# Patient Record
Sex: Male | Born: 1965 | Race: Black or African American | Hispanic: No | Smoking: Never smoker
Health system: Southern US, Community
[De-identification: ages and names within clinical notes are randomized; demographics above are authoritative.]

## PROBLEM LIST (undated history)

## (undated) DIAGNOSIS — I10 Essential (primary) hypertension: Secondary | ICD-10-CM

## (undated) DIAGNOSIS — J45909 Unspecified asthma, uncomplicated: Secondary | ICD-10-CM

## (undated) DIAGNOSIS — K219 Gastro-esophageal reflux disease without esophagitis: Secondary | ICD-10-CM

## (undated) HISTORY — PX: VASECTOMY: SHX75

---

## 2016-03-06 ENCOUNTER — Other Ambulatory Visit: Payer: Self-pay | Admitting: Gastroenterology

## 2016-03-06 DIAGNOSIS — R1084 Generalized abdominal pain: Secondary | ICD-10-CM

## 2016-03-15 ENCOUNTER — Ambulatory Visit: Admission: RE | Admit: 2016-03-15 | Payer: BLUE CROSS/BLUE SHIELD | Source: Ambulatory Visit

## 2016-05-06 DIAGNOSIS — B009 Herpesviral infection, unspecified: Secondary | ICD-10-CM

## 2016-05-06 HISTORY — DX: Herpesviral infection, unspecified: B00.9

## 2017-05-29 ENCOUNTER — Other Ambulatory Visit: Payer: Self-pay | Admitting: Orthopedic Surgery

## 2017-05-29 DIAGNOSIS — G8929 Other chronic pain: Secondary | ICD-10-CM

## 2017-05-29 DIAGNOSIS — S72491A Other fracture of lower end of right femur, initial encounter for closed fracture: Secondary | ICD-10-CM

## 2017-05-29 DIAGNOSIS — M25561 Pain in right knee: Principal | ICD-10-CM

## 2017-06-10 ENCOUNTER — Encounter (INDEPENDENT_AMBULATORY_CARE_PROVIDER_SITE_OTHER): Payer: Self-pay

## 2017-06-10 ENCOUNTER — Ambulatory Visit
Admission: RE | Admit: 2017-06-10 | Discharge: 2017-06-10 | Disposition: A | Discharge status: Home / Self Care | Payer: No Typology Code available for payment source | Source: Ambulatory Visit | Attending: Orthopedic Surgery | Admitting: Orthopedic Surgery

## 2017-06-10 DIAGNOSIS — S72491A Other fracture of lower end of right femur, initial encounter for closed fracture: Secondary | ICD-10-CM | POA: Diagnosis not present

## 2017-06-10 DIAGNOSIS — S83221A Peripheral tear of medial meniscus, current injury, right knee, initial encounter: Secondary | ICD-10-CM | POA: Diagnosis not present

## 2017-06-10 DIAGNOSIS — M7121 Synovial cyst of popliteal space [Baker], right knee: Secondary | ICD-10-CM | POA: Diagnosis not present

## 2017-06-10 DIAGNOSIS — M25461 Effusion, right knee: Secondary | ICD-10-CM | POA: Diagnosis not present

## 2017-06-10 DIAGNOSIS — M25561 Pain in right knee: Secondary | ICD-10-CM | POA: Diagnosis not present

## 2017-06-10 DIAGNOSIS — M2351 Chronic instability of knee, right knee: Secondary | ICD-10-CM | POA: Diagnosis present

## 2017-06-10 DIAGNOSIS — M2341 Loose body in knee, right knee: Secondary | ICD-10-CM | POA: Diagnosis not present

## 2017-06-10 DIAGNOSIS — M25861 Other specified joint disorders, right knee: Secondary | ICD-10-CM | POA: Insufficient documentation

## 2017-06-10 DIAGNOSIS — G8929 Other chronic pain: Secondary | ICD-10-CM | POA: Insufficient documentation

## 2017-06-25 ENCOUNTER — Other Ambulatory Visit: Payer: Self-pay | Admitting: Orthopedic Surgery

## 2017-06-25 DIAGNOSIS — M25569 Pain in unspecified knee: Secondary | ICD-10-CM

## 2017-07-09 ENCOUNTER — Ambulatory Visit
Admission: RE | Admit: 2017-07-09 | Discharge: 2017-07-09 | Disposition: A | Discharge status: Home / Self Care | Payer: No Typology Code available for payment source | Source: Ambulatory Visit | Attending: Orthopedic Surgery | Admitting: Orthopedic Surgery

## 2017-07-09 ENCOUNTER — Other Ambulatory Visit: Payer: Self-pay | Admitting: Orthopedic Surgery

## 2017-07-09 DIAGNOSIS — M21162 Varus deformity, not elsewhere classified, left knee: Secondary | ICD-10-CM | POA: Insufficient documentation

## 2017-07-09 DIAGNOSIS — M25569 Pain in unspecified knee: Secondary | ICD-10-CM | POA: Insufficient documentation

## 2017-07-09 DIAGNOSIS — Z01818 Encounter for other preprocedural examination: Secondary | ICD-10-CM | POA: Insufficient documentation

## 2017-07-09 DIAGNOSIS — M21161 Varus deformity, not elsewhere classified, right knee: Secondary | ICD-10-CM | POA: Insufficient documentation

## 2017-08-11 ENCOUNTER — Inpatient Hospital Stay: Admission: RE | Admit: 2017-08-11 | Payer: No Typology Code available for payment source | Source: Ambulatory Visit

## 2017-08-18 ENCOUNTER — Ambulatory Visit
Admission: RE | Admit: 2017-08-18 | Payer: No Typology Code available for payment source | Source: Ambulatory Visit | Admitting: Orthopedic Surgery

## 2017-08-18 ENCOUNTER — Encounter: Admission: RE | Payer: Self-pay | Source: Ambulatory Visit

## 2017-08-18 SURGERY — APPLICATION, GRAFT, OSTEOCHONDRAL, KNEE
Anesthesia: Choice | Laterality: Right

## 2018-11-03 ENCOUNTER — Other Ambulatory Visit: Payer: Self-pay | Admitting: Orthopedic Surgery

## 2018-11-03 DIAGNOSIS — M25569 Pain in unspecified knee: Secondary | ICD-10-CM

## 2018-11-05 ENCOUNTER — Other Ambulatory Visit: Payer: Self-pay | Admitting: Orthopedic Surgery

## 2018-11-05 DIAGNOSIS — M93261 Osteochondritis dissecans, right knee: Secondary | ICD-10-CM

## 2018-11-24 ENCOUNTER — Ambulatory Visit
Admission: RE | Admit: 2018-11-24 | Discharge: 2018-11-24 | Disposition: A | Discharge status: Home / Self Care | Payer: PRIVATE HEALTH INSURANCE | Source: Ambulatory Visit | Attending: Orthopedic Surgery | Admitting: Orthopedic Surgery

## 2018-11-24 ENCOUNTER — Other Ambulatory Visit: Payer: Self-pay

## 2018-11-24 DIAGNOSIS — M93261 Osteochondritis dissecans, right knee: Secondary | ICD-10-CM | POA: Diagnosis present

## 2018-11-24 DIAGNOSIS — M25569 Pain in unspecified knee: Secondary | ICD-10-CM | POA: Insufficient documentation

## 2019-02-18 ENCOUNTER — Encounter
Admission: RE | Admit: 2019-02-18 | Discharge: 2019-02-18 | Disposition: A | Discharge status: Home / Self Care | Payer: PRIVATE HEALTH INSURANCE | Source: Ambulatory Visit | Attending: Surgery | Admitting: Surgery

## 2019-02-18 ENCOUNTER — Other Ambulatory Visit: Payer: Self-pay

## 2019-02-18 DIAGNOSIS — I10 Essential (primary) hypertension: Secondary | ICD-10-CM | POA: Diagnosis not present

## 2019-02-18 DIAGNOSIS — Z01812 Encounter for preprocedural laboratory examination: Secondary | ICD-10-CM | POA: Diagnosis not present

## 2019-02-18 HISTORY — DX: Essential (primary) hypertension: I10

## 2019-02-18 LAB — URINALYSIS, ROUTINE W REFLEX MICROSCOPIC
Bilirubin Urine: NEGATIVE
Glucose, UA: NEGATIVE mg/dL
Hgb urine dipstick: NEGATIVE
Ketones, ur: NEGATIVE mg/dL
Leukocytes,Ua: NEGATIVE
Nitrite: NEGATIVE
Protein, ur: NEGATIVE mg/dL
Specific Gravity, Urine: 1.015 (ref 1.005–1.030)
pH: 6 (ref 5.0–8.0)

## 2019-02-18 LAB — TYPE AND SCREEN
ABO/RH(D): O POS
Antibody Screen: NEGATIVE

## 2019-02-18 LAB — CBC
HCT: 37.2 % — ABNORMAL LOW (ref 39.0–52.0)
Hemoglobin: 11.9 g/dL — ABNORMAL LOW (ref 13.0–17.0)
MCH: 26.9 pg (ref 26.0–34.0)
MCHC: 32 g/dL (ref 30.0–36.0)
MCV: 84 fL (ref 80.0–100.0)
Platelets: 296 10*3/uL (ref 150–400)
RBC: 4.43 MIL/uL (ref 4.22–5.81)
RDW: 13.1 % (ref 11.5–15.5)
WBC: 5 10*3/uL (ref 4.0–10.5)
nRBC: 0 % (ref 0.0–0.2)

## 2019-02-18 LAB — SURGICAL PCR SCREEN
MRSA, PCR: NEGATIVE
Staphylococcus aureus: POSITIVE — AB

## 2019-02-18 LAB — BASIC METABOLIC PANEL
Anion gap: 8 (ref 5–15)
BUN: 15 mg/dL (ref 6–20)
CO2: 30 mmol/L (ref 22–32)
Calcium: 9.6 mg/dL (ref 8.9–10.3)
Chloride: 102 mmol/L (ref 98–111)
Creatinine, Ser: 1.02 mg/dL (ref 0.61–1.24)
GFR calc Af Amer: >60 mL/min (ref >60)
GFR calc non Af Amer: >60 mL/min (ref >60)
Glucose, Bld: 98 mg/dL (ref 70–99)
Potassium: 4.2 mmol/L (ref 3.5–5.1)
Sodium: 140 mmol/L (ref 135–145)

## 2019-02-18 NOTE — Patient Instructions (Signed)
Your procedure is scheduled on: Thursday 02/25/19.  Report to DAY SURGERY DEPARTMENT LOCATED ON 2ND FLOOR MEDICAL MALL ENTRANCE. To find out your arrival time please call [phone] between 1PM - 3PM on Wednesday 02/24/19.   Remember: Instructions that are not followed completely may result in serious medical risk, up to and including death, or upon the discretion of your surgeon and anesthesiologist your surgery may need to be rescheduled.      _X__ 1. Do not eat food after midnight the night before your procedure.                 No gum chewing or hard candies. You may drink clear liquids up to 2 hours                 before you are scheduled to arrive for your surgery- DO NOT drink clear                 liquids within 2 hours of the start of your surgery.                 Clear Liquids include:  water, apple juice without pulp, clear carbohydrate                 drink such as Clearfast or Gatorade, Black Coffee or Tea (Do not add                 milk or creamer to coffee or tea).    __X__2.  On the morning of surgery brush your teeth with toothpaste and water, you may rinse your mouth with mouthwash if you wish.  Do not swallow any toothpaste or mouthwash.      __X__3.  Notify your doctor if there is any change in your medical condition      (cold, fever, infections).      Do not wear jewelry, make-up, hairpins, clips or nail polish. Do not wear lotions, powders, or perfumes.  Do not shave 48 hours prior to surgery. Men may shave face and neck. Do not bring valuables to the hospital.      [facility] is not responsible for any belongings or valuables.    Contacts, dentures/partials or body piercings may not be worn into surgery. Bring a case for your contacts, glasses or hearing aids, a denture cup will be supplied.     Please read over the following fact sheets that you were given:   MRSA Information     __X__ Take these medicines the morning of surgery with A  SIP OF WATER:     1. amLODipine (NORVASC) 5 MG tablet      __X__ Use CHG Soap as directed    __X__ Stop Anti-inflammatories 7 days before surgery such as Advil, Ibuprofen, Motrin, BC or Goodies Powder, Naprosyn, Naproxen, Aleve, Aspirin, Meloxicam. May take Tylenol if needed for pain or discomfort.     __X__ Stop the following herbal supplements today:  Ascorbic Acid (VITAMIN C) 100 MG tablet

## 2019-02-18 NOTE — Progress Notes (Signed)
Pre-Admit Testing Provider Notification Note  Provider Notified: Dr. Roland Rack  Notification Mode: Fax  Reason: Abnormal PCR results.   Response: Fax confirmation received.  Additional Information: Noted on Pre-Admit Worksheet. Placed on chart.  Signed: Beulah Gandy, RN

## 2019-02-19 LAB — URINE CULTURE: Culture: NO GROWTH

## 2019-02-22 ENCOUNTER — Other Ambulatory Visit: Admission: RE | Admit: 2019-02-22 | Payer: PRIVATE HEALTH INSURANCE | Source: Ambulatory Visit

## 2019-02-23 ENCOUNTER — Other Ambulatory Visit: Payer: Self-pay

## 2019-02-23 ENCOUNTER — Other Ambulatory Visit
Admission: RE | Admit: 2019-02-23 | Discharge: 2019-02-23 | Disposition: A | Discharge status: Home / Self Care | Payer: PRIVATE HEALTH INSURANCE | Source: Ambulatory Visit | Attending: Surgery | Admitting: Surgery

## 2019-02-23 LAB — SARS CORONAVIRUS 2 (TAT 6-24 HRS): SARS Coronavirus 2: NEGATIVE

## 2019-02-24 MED ORDER — CEFAZOLIN SODIUM-DEXTROSE 2-4 GM/100ML-% IV SOLN
2.0000 g | Freq: Once | INTRAVENOUS | Status: AC
  Administered 2019-02-25: 2 g via INTRAVENOUS

## 2019-02-25 ENCOUNTER — Inpatient Hospital Stay: Payer: PRIVATE HEALTH INSURANCE | Admitting: Anesthesiology

## 2019-02-25 ENCOUNTER — Observation Stay: Payer: PRIVATE HEALTH INSURANCE

## 2019-02-25 ENCOUNTER — Inpatient Hospital Stay
Admission: RE | Admit: 2019-02-25 | Discharge: 2019-02-26 | DRG: 470 | Disposition: A | Discharge status: Home / Self Care | Payer: PRIVATE HEALTH INSURANCE | Attending: Surgery | Admitting: Surgery

## 2019-02-25 ENCOUNTER — Encounter
Admission: RE | Disposition: A | Discharge status: Home / Self Care | Payer: Self-pay | Source: Home / Self Care | Attending: Surgery

## 2019-02-25 ENCOUNTER — Encounter: Payer: Self-pay | Admitting: *Deleted

## 2019-02-25 ENCOUNTER — Other Ambulatory Visit: Payer: Self-pay

## 2019-02-25 DIAGNOSIS — Z96651 Presence of right artificial knee joint: Secondary | ICD-10-CM

## 2019-02-25 DIAGNOSIS — I10 Essential (primary) hypertension: Secondary | ICD-10-CM | POA: Diagnosis present

## 2019-02-25 DIAGNOSIS — M25761 Osteophyte, right knee: Secondary | ICD-10-CM | POA: Diagnosis present

## 2019-02-25 DIAGNOSIS — M21861 Other specified acquired deformities of right lower leg: Secondary | ICD-10-CM | POA: Diagnosis present

## 2019-02-25 DIAGNOSIS — Z20828 Contact with and (suspected) exposure to other viral communicable diseases: Secondary | ICD-10-CM | POA: Diagnosis present

## 2019-02-25 HISTORY — PX: PARTIAL KNEE ARTHROPLASTY: SHX2174

## 2019-02-25 LAB — ABO/RH: ABO/RH(D): O POS

## 2019-02-25 SURGERY — ARTHROPLASTY, KNEE, UNICOMPARTMENTAL
Anesthesia: Spinal | Site: Knee | Laterality: Right

## 2019-02-25 MED ORDER — TRAMADOL HCL 50 MG PO TABS
50.0000 mg | ORAL_TABLET | Freq: Four times a day (QID) | ORAL | Status: DC | PRN
  Administered 2019-02-26: 50 mg via ORAL
  Filled 2019-02-25: qty 1

## 2019-02-25 MED ORDER — LIDOCAINE HCL (PF) 2 % IJ SOLN
INTRAMUSCULAR | Status: AC
  Filled 2019-02-25: qty 10

## 2019-02-25 MED ORDER — MAGNESIUM HYDROXIDE 400 MG/5ML PO SUSP: 30.0000 mL | Freq: Every day | ORAL | Status: DC | PRN

## 2019-02-25 MED ORDER — SODIUM CHLORIDE 0.9 % IV SOLN
INTRAVENOUS | Status: DC | PRN
  Administered 2019-02-25: 08:00:00 30 ug/min via INTRAVENOUS

## 2019-02-25 MED ORDER — ONDANSETRON HCL 4 MG PO TABS: 4.0000 mg | ORAL_TABLET | Freq: Four times a day (QID) | ORAL | Status: DC | PRN

## 2019-02-25 MED ORDER — HYDROCODONE-ACETAMINOPHEN 5-325 MG PO TABS: 1.0000 | ORAL_TABLET | ORAL | Status: DC | PRN

## 2019-02-25 MED ORDER — FLEET ENEMA 7-19 GM/118ML RE ENEM: 1.0000 | ENEMA | Freq: Once | RECTAL | Status: DC | PRN

## 2019-02-25 MED ORDER — BUPIVACAINE LIPOSOME 1.3 % IJ SUSP
INTRAMUSCULAR | Status: AC
  Filled 2019-02-25: qty 20

## 2019-02-25 MED ORDER — ADULT MULTIVITAMIN W/MINERALS CH
1.0000 | ORAL_TABLET | Freq: Every day | ORAL | Status: DC
  Administered 2019-02-25 – 2019-02-26 (×2): 1 via ORAL
  Filled 2019-02-25 (×2): qty 1

## 2019-02-25 MED ORDER — ENOXAPARIN SODIUM 40 MG/0.4ML ~~LOC~~ SOLN
40.0000 mg | SUBCUTANEOUS | Status: DC
  Administered 2019-02-26: 40 mg via SUBCUTANEOUS
  Filled 2019-02-25: qty 0.4

## 2019-02-25 MED ORDER — TRANEXAMIC ACID 1000 MG/10ML IV SOLN
INTRAVENOUS | Status: AC
  Filled 2019-02-25: qty 10

## 2019-02-25 MED ORDER — KETOROLAC TROMETHAMINE 30 MG/ML IJ SOLN
30.0000 mg | Freq: Once | INTRAMUSCULAR | Status: AC
  Administered 2019-02-25: 11:00:00 30 mg via INTRAVENOUS

## 2019-02-25 MED ORDER — PROPOFOL 10 MG/ML IV BOLUS
INTRAVENOUS | Status: DC | PRN
  Administered 2019-02-25 (×2): 18 mg via INTRAVENOUS

## 2019-02-25 MED ORDER — VITAMIN C 100 MG PO TABS: 100.0000 mg | ORAL_TABLET | Freq: Every day | ORAL | Status: DC

## 2019-02-25 MED ORDER — SODIUM CHLORIDE 0.9 % IV SOLN
INTRAVENOUS | Status: DC
  Administered 2019-02-25 – 2019-02-26 (×2): via INTRAVENOUS

## 2019-02-25 MED ORDER — CEFAZOLIN SODIUM-DEXTROSE 2-4 GM/100ML-% IV SOLN
INTRAVENOUS | Status: AC
  Filled 2019-02-25: qty 100

## 2019-02-25 MED ORDER — FAMOTIDINE 20 MG PO TABS: 20.0000 mg | ORAL_TABLET | Freq: Once | ORAL | Status: DC

## 2019-02-25 MED ORDER — CEFAZOLIN SODIUM-DEXTROSE 2-4 GM/100ML-% IV SOLN
2.0000 g | Freq: Four times a day (QID) | INTRAVENOUS | Status: AC
  Administered 2019-02-25 – 2019-02-26 (×3): 2 g via INTRAVENOUS
  Filled 2019-02-25 (×3): qty 100

## 2019-02-25 MED ORDER — MIDAZOLAM HCL 5 MG/5ML IJ SOLN
INTRAMUSCULAR | Status: DC | PRN
  Administered 2019-02-25: 2 mg via INTRAVENOUS

## 2019-02-25 MED ORDER — FENTANYL CITRATE (PF) 100 MCG/2ML IJ SOLN: 25.0000 ug | INTRAMUSCULAR | Status: DC | PRN

## 2019-02-25 MED ORDER — GLYCOPYRROLATE 0.2 MG/ML IJ SOLN
INTRAMUSCULAR | Status: AC
  Filled 2019-02-25: qty 1

## 2019-02-25 MED ORDER — PHENYLEPHRINE HCL (PRESSORS) 10 MG/ML IV SOLN
INTRAVENOUS | Status: AC
  Filled 2019-02-25: qty 1

## 2019-02-25 MED ORDER — MIDAZOLAM HCL 2 MG/2ML IJ SOLN
INTRAMUSCULAR | Status: AC
  Filled 2019-02-25: qty 2

## 2019-02-25 MED ORDER — ACETAMINOPHEN 500 MG PO TABS
500.0000 mg | ORAL_TABLET | Freq: Four times a day (QID) | ORAL | Status: AC
  Administered 2019-02-25 – 2019-02-26 (×4): 500 mg via ORAL
  Filled 2019-02-25 (×4): qty 1

## 2019-02-25 MED ORDER — KETOROLAC TROMETHAMINE 30 MG/ML IJ SOLN
INTRAMUSCULAR | Status: AC
  Administered 2019-02-25: 30 mg via INTRAVENOUS
  Filled 2019-02-25: qty 1

## 2019-02-25 MED ORDER — METOCLOPRAMIDE HCL 10 MG PO TABS: 5.0000 mg | ORAL_TABLET | Freq: Three times a day (TID) | ORAL | Status: DC | PRN

## 2019-02-25 MED ORDER — LACTATED RINGERS IV SOLN
INTRAVENOUS | Status: DC
  Administered 2019-02-25: 07:00:00 via INTRAVENOUS

## 2019-02-25 MED ORDER — EPINEPHRINE PF 1 MG/ML IJ SOLN
INTRAMUSCULAR | Status: AC
  Filled 2019-02-25: qty 1

## 2019-02-25 MED ORDER — FENTANYL CITRATE (PF) 100 MCG/2ML IJ SOLN
INTRAMUSCULAR | Status: DC | PRN
  Administered 2019-02-25 (×2): 50 ug via INTRAVENOUS

## 2019-02-25 MED ORDER — BISACODYL 10 MG RE SUPP: 10.0000 mg | Freq: Every day | RECTAL | Status: DC | PRN

## 2019-02-25 MED ORDER — ONDANSETRON HCL 4 MG/2ML IJ SOLN: 4.0000 mg | Freq: Four times a day (QID) | INTRAMUSCULAR | Status: DC | PRN

## 2019-02-25 MED ORDER — OXYCODONE HCL 5 MG/5ML PO SOLN: 5.0000 mg | Freq: Once | ORAL | Status: DC | PRN

## 2019-02-25 MED ORDER — DIPHENHYDRAMINE HCL 12.5 MG/5ML PO ELIX: 12.5000 mg | ORAL_SOLUTION | ORAL | Status: DC | PRN

## 2019-02-25 MED ORDER — VITAMIN C 500 MG PO TABS
1000.0000 mg | ORAL_TABLET | Freq: Every day | ORAL | Status: DC
  Administered 2019-02-26: 1000 mg via ORAL
  Filled 2019-02-25: qty 2

## 2019-02-25 MED ORDER — PROPOFOL 500 MG/50ML IV EMUL
INTRAVENOUS | Status: AC
  Filled 2019-02-25: qty 50

## 2019-02-25 MED ORDER — KETOROLAC TROMETHAMINE 15 MG/ML IJ SOLN
15.0000 mg | Freq: Four times a day (QID) | INTRAMUSCULAR | Status: DC
  Administered 2019-02-25 – 2019-02-26 (×3): 15 mg via INTRAVENOUS
  Filled 2019-02-25 (×3): qty 1

## 2019-02-25 MED ORDER — AMLODIPINE BESYLATE 5 MG PO TABS: 5.0000 mg | ORAL_TABLET | Freq: Every day | ORAL | Status: DC

## 2019-02-25 MED ORDER — PROPOFOL 500 MG/50ML IV EMUL
INTRAVENOUS | Status: DC | PRN
  Administered 2019-02-25: 60 ug/kg/min via INTRAVENOUS

## 2019-02-25 MED ORDER — METOCLOPRAMIDE HCL 5 MG/ML IJ SOLN: 5.0000 mg | Freq: Three times a day (TID) | INTRAMUSCULAR | Status: DC | PRN

## 2019-02-25 MED ORDER — TRANEXAMIC ACID 1000 MG/10ML IV SOLN
INTRAVENOUS | Status: DC | PRN
  Administered 2019-02-25: 1000 mg via TOPICAL

## 2019-02-25 MED ORDER — BUPIVACAINE-EPINEPHRINE (PF) 0.5% -1:200000 IJ SOLN
INTRAMUSCULAR | Status: DC | PRN
  Administered 2019-02-25: 30 mL

## 2019-02-25 MED ORDER — BUPIVACAINE HCL (PF) 0.5 % IJ SOLN
INTRAMUSCULAR | Status: DC | PRN
  Administered 2019-02-25: 3 mL

## 2019-02-25 MED ORDER — BUPIVACAINE HCL (PF) 0.5 % IJ SOLN
INTRAMUSCULAR | Status: AC
  Filled 2019-02-25: qty 10

## 2019-02-25 MED ORDER — DOCUSATE SODIUM 100 MG PO CAPS
100.0000 mg | ORAL_CAPSULE | Freq: Two times a day (BID) | ORAL | Status: DC
  Administered 2019-02-26: 100 mg via ORAL
  Filled 2019-02-25 (×2): qty 1

## 2019-02-25 MED ORDER — SODIUM CHLORIDE 0.9 % IV SOLN
INTRAVENOUS | Status: DC | PRN
  Administered 2019-02-25: 09:00:00 60 mL

## 2019-02-25 MED ORDER — SILDENAFIL CITRATE 100 MG PO TABS: 100.0000 mg | ORAL_TABLET | Freq: Every day | ORAL | Status: DC | PRN

## 2019-02-25 MED ORDER — SODIUM CHLORIDE FLUSH 0.9 % IV SOLN
INTRAVENOUS | Status: AC
  Filled 2019-02-25: qty 40

## 2019-02-25 MED ORDER — BUPIVACAINE HCL (PF) 0.5 % IJ SOLN
INTRAMUSCULAR | Status: AC
  Filled 2019-02-25: qty 30

## 2019-02-25 MED ORDER — MORPHINE SULFATE (PF) 2 MG/ML IV SOLN: 2.0000 mg | INTRAVENOUS | Status: DC | PRN

## 2019-02-25 MED ORDER — FAMOTIDINE 20 MG PO TABS
ORAL_TABLET | ORAL | Status: AC
  Filled 2019-02-25: qty 1

## 2019-02-25 MED ORDER — FENTANYL CITRATE (PF) 100 MCG/2ML IJ SOLN
INTRAMUSCULAR | Status: AC
  Filled 2019-02-25: qty 2

## 2019-02-25 MED ORDER — ACETAMINOPHEN 325 MG PO TABS
325.0000 mg | ORAL_TABLET | Freq: Four times a day (QID) | ORAL | Status: DC | PRN
  Administered 2019-02-26: 650 mg via ORAL
  Filled 2019-02-25: qty 2

## 2019-02-25 MED ORDER — OXYCODONE HCL 5 MG PO TABS: 5.0000 mg | ORAL_TABLET | Freq: Once | ORAL | Status: DC | PRN

## 2019-02-25 MED ORDER — PROPOFOL 10 MG/ML IV BOLUS
INTRAVENOUS | Status: AC
  Filled 2019-02-25: qty 20

## 2019-02-25 SURGICAL SUPPLY — 66 items
BEARING MENISCAL TIBIAL 3 MD R (Orthopedic Implant) ×3 IMPLANT
BNDG ELASTIC 6X5.8 VLCR STR LF (GAUZE/BANDAGES/DRESSINGS) ×3 IMPLANT
CANISTER SUCT 1200ML W/VALVE (MISCELLANEOUS) ×3 IMPLANT
CANISTER SUCT 3000ML PPV (MISCELLANEOUS) ×3 IMPLANT
CEMENT BONE R 1X40 (Cement) ×3 IMPLANT
CEMENT VACUUM MIXING SYSTEM (MISCELLANEOUS) ×3 IMPLANT
CHLORAPREP W/TINT 26 (MISCELLANEOUS) ×6 IMPLANT
COOLER POLAR GLACIER W/PUMP (MISCELLANEOUS) ×3 IMPLANT
COVER MAYO STAND REUSABLE (DRAPES) ×6 IMPLANT
COVER WAND RF STERILE (DRAPES) ×3 IMPLANT
CUFF TOURN SGL QUICK 24 (TOURNIQUET CUFF)
CUFF TOURN SGL QUICK 30 (TOURNIQUET CUFF) ×2
CUFF TRNQT CYL 24X4X16.5-23 (TOURNIQUET CUFF) IMPLANT
CUFF TRNQT CYL 30X4X21-28X (TOURNIQUET CUFF) ×1 IMPLANT
DRAPE C-ARM XRAY 36X54 (DRAPES) ×3 IMPLANT
DRSG OPSITE POSTOP 4X12 (GAUZE/BANDAGES/DRESSINGS) IMPLANT
DRSG OPSITE POSTOP 4X14 (GAUZE/BANDAGES/DRESSINGS) IMPLANT
DRSG OPSITE POSTOP 4X6 (GAUZE/BANDAGES/DRESSINGS) ×3 IMPLANT
DRSG OPSITE POSTOP 4X8 (GAUZE/BANDAGES/DRESSINGS) ×3 IMPLANT
ELECT CAUTERY BLADE 6.4 (BLADE) ×3 IMPLANT
ELECT REM PT RETURN 9FT ADLT (ELECTROSURGICAL) ×3
ELECTRODE REM PT RTRN 9FT ADLT (ELECTROSURGICAL) ×1 IMPLANT
GAUZE 4X4 16PLY RFD (DISPOSABLE) ×3 IMPLANT
GAUZE SPONGE 4X4 12PLY STRL (GAUZE/BANDAGES/DRESSINGS) ×3 IMPLANT
GAUZE XEROFORM 1X8 LF (GAUZE/BANDAGES/DRESSINGS) IMPLANT
GLOVE BIO SURGEON STRL SZ7.5 (GLOVE) ×12 IMPLANT
GLOVE BIO SURGEON STRL SZ8 (GLOVE) ×12 IMPLANT
GLOVE BIOGEL PI IND STRL 8 (GLOVE) ×1 IMPLANT
GLOVE BIOGEL PI INDICATOR 8 (GLOVE) ×2
GLOVE INDICATOR 8.0 STRL GRN (GLOVE) ×6 IMPLANT
GLOVE SURG SYN 6.5 ES PF (GLOVE) ×9 IMPLANT
GOWN STRL REUS W/ TWL LRG LVL3 (GOWN DISPOSABLE) ×3 IMPLANT
GOWN STRL REUS W/ TWL XL LVL3 (GOWN DISPOSABLE) ×1 IMPLANT
GOWN STRL REUS W/TWL LRG LVL3 (GOWN DISPOSABLE) ×6
GOWN STRL REUS W/TWL XL LVL3 (GOWN DISPOSABLE) ×2
HOLDER FOLEY CATH W/STRAP (MISCELLANEOUS) ×3 IMPLANT
HOOD PEEL AWAY FLYTE STAYCOOL (MISCELLANEOUS) ×9 IMPLANT
KIT TURNOVER KIT A (KITS) ×3 IMPLANT
MAT ABSORB  FLUID 56X50 GRAY (MISCELLANEOUS) ×2
MAT ABSORB FLUID 56X50 GRAY (MISCELLANEOUS) ×1 IMPLANT
NDL SAFETY ECLIPSE 18X1.5 (NEEDLE) ×1 IMPLANT
NEEDLE HYPO 18GX1.5 SHARP (NEEDLE) ×2
NEEDLE SPNL 20GX3.5 QUINCKE YW (NEEDLE) ×3 IMPLANT
NS IRRIG 1000ML POUR BTL (IV SOLUTION) ×3 IMPLANT
PACK BLADE SAW RECIP 70 3 PT (BLADE) ×3 IMPLANT
PACK TOTAL KNEE (MISCELLANEOUS) ×3 IMPLANT
PAD WRAPON POLAR KNEE (MISCELLANEOUS) ×1 IMPLANT
PEG TWIN FEM CEMENTED MED (Knees) ×3 IMPLANT
PULSAVAC PLUS IRRIG FAN TIP (DISPOSABLE) ×3
SOL .9 NS 3000ML IRR  AL (IV SOLUTION) ×2
SOL .9 NS 3000ML IRR UROMATIC (IV SOLUTION) ×1 IMPLANT
STAPLER SKIN PROX 35W (STAPLE) ×3 IMPLANT
STRAP SAFETY 5IN WIDE (MISCELLANEOUS) ×3 IMPLANT
SUCTION FRAZIER HANDLE 10FR (MISCELLANEOUS) ×2
SUCTION TUBE FRAZIER 10FR DISP (MISCELLANEOUS) ×1 IMPLANT
SUT VIC AB 0 CT1 36 (SUTURE) ×3 IMPLANT
SUT VIC AB 2-0 CT1 27 (SUTURE) ×8
SUT VIC AB 2-0 CT1 TAPERPNT 27 (SUTURE) ×4 IMPLANT
SYR 10ML LL (SYRINGE) ×3 IMPLANT
SYR 20ML LL LF (SYRINGE) ×3 IMPLANT
SYR 30ML LL (SYRINGE) ×9 IMPLANT
TAPE TRANSPORE STRL 2 31045 (GAUZE/BANDAGES/DRESSINGS) ×3 IMPLANT
TIP FAN IRRIG PULSAVAC PLUS (DISPOSABLE) ×1 IMPLANT
TRAY FOLEY MTR SLVR 16FR STAT (SET/KITS/TRAYS/PACK) ×3 IMPLANT
TRAY TIBIAL OXFORD SZ C RT (Joint) ×3 IMPLANT
WRAPON POLAR PAD KNEE (MISCELLANEOUS) ×3

## 2019-02-25 NOTE — Progress Notes (Signed)
Physical Therapy Evaluation Patient Details Name: Jeremy Cox MRN: [id number] DOB: October 19, 1965 Today's Date: 02/25/2019   History of Present Illness  Pt underwent R unicondylar knee replacement without reported post-op complications. He is POD#0 at time of initial PT evaluation. PMH includes HTN  Clinical Impression  Pt admitted with above diagnosis. Pt currently with functional limitations due to the deficits listed below (see PT Problem List).  Pt is fully independent with bed mobility and supervision only for transfers. He demonstrates safe hand placement during transfers. Slight decrease in weight shift to RLE during transfer. He is steady in standing with minimal UE support on walker and is able to ambulate from bed to recliner with rolling walker. Education provided for proper sequencing utilizing step-to pattern. Pt demonstrates excellent stability with no buckling of RLE noted. He denies any dizziness or lightheadedness during transfers and ambulation. He is able to complete all supine exercises demonstrating good quad control. He is able to perform R SAQ and SLR without assistance. Will increase ambulation distance tomorrow and practice steps. He will need a rolling walker at discharge. Recommend HH PT however pending MD preference and transportation may be appropriate to start with OP PT. Pt will benefit from PT services to address deficits in strength, balance, and mobility in order to return to full function at home.     Follow Up Recommendations Home health PT;Other (comment)(Per MD preference and transportation may be OK for OP PT)    Equipment Recommendations  Rolling walker with 5" wheels    Recommendations for Other Services       Precautions / Restrictions Precautions Precautions: Knee Precaution Booklet Issued: Yes (comment) Restrictions Weight Bearing Restrictions: Yes RLE Weight Bearing: Weight bearing as tolerated      Mobility  Bed Mobility Overal bed mobility:  Independent             General bed mobility comments: Good speed and sequencing  Transfers Overall transfer level: Needs assistance Equipment used: Rolling walker (2 wheeled) Transfers: Sit to/from Stand Sit to Stand: Supervision         General transfer comment: Pt demonstrates safe hand placement during transfer. Slight decrease in weight shift to RLE during transfer. Pt is steady in standing with minimal UE support on walker  Ambulation/Gait Ambulation/Gait assistance: Min guard Gait Distance (Feet): 5 Feet Assistive device: Rolling walker (2 wheeled)       General Gait Details: Pt is able to ambulate from bed to recliner with rolling walker. Education provided for proper sequencing utilizing step-to pattern. Pt demonstrates excellent stability with no buckling of RLE noted. Dt denies any dizziness or lightheadedness during transfers and ambulation  Stairs            Wheelchair Mobility    Modified Rankin (Stroke Patients Only)       Balance Overall balance assessment: Needs assistance Sitting-balance support: No upper extremity supported Sitting balance-Leahy Scale: Good     Standing balance support: No upper extremity supported Standing balance-Leahy Scale: Fair Standing balance comment: Pt able to balance without UE support in standing                             Pertinent Vitals/Pain Pain Assessment: No/denies pain    Home Living Family/patient expects to be discharged to:: Private residence Living Arrangements: Other relatives Available Help at Discharge: Family Type of Home: House Home Access: Level entry     Home Layout: Multi-level;1/2 bath on main  level;Able to live on main level with bedroom/bathroom Home Equipment: Shower seat - built in      Prior Function Level of Independence: Independent         Comments: Independent with ADLs/IADLs, no falls, drives, works full time     Higher education careers adviser   Dominant Hand:  Right    Extremity/Trunk Assessment   Upper Extremity Assessment Upper Extremity Assessment: Overall WFL for tasks assessed    Lower Extremity Assessment Lower Extremity Assessment: RLE deficits/detail RLE Deficits / Details: Pt reports minimal numbness bilateral feet but otherwise sensation appears intact. Pt able to perform full SAQ and SLR without assistance       Communication   Communication: No difficulties  Cognition Arousal/Alertness: Awake/alert Behavior During Therapy: WFL for tasks assessed/performed Overall Cognitive Status: Within Functional Limits for tasks assessed                                        General Comments      Exercises Total Joint Exercises Ankle Circles/Pumps: AROM;Both;10 reps Quad Sets: Strengthening;Both;10 reps Gluteal Sets: Strengthening;Both;10 reps Short Arc Quad: Strengthening;Right;10 reps Heel Slides: Strengthening;Right;10 reps Hip ABduction/ADduction: Strengthening;Right;10 reps Straight Leg Raises: Strengthening;Right;10 reps Goniometric ROM: R knee extension is close to 0 but limited slightly by dressing. R knee flexion at least 60 degrees but also significantly limited by dressing at time of evaluation. Minimal pain evident close to end range R knee flexion   Assessment/Plan    PT Assessment Patient needs continued PT services  PT Problem List Decreased strength;Decreased activity tolerance;Decreased range of motion;Decreased balance;Decreased mobility       PT Treatment Interventions DME instruction;Gait training;Stair training;Functional mobility training;Therapeutic activities;Therapeutic exercise;Balance training;Neuromuscular re-education;Patient/family education;Manual techniques    PT Goals (Current goals can be found in the Care Plan section)  Acute Rehab PT Goals Patient Stated Goal: Return to prior level of function at home PT Goal Formulation: With patient Time For Goal Achievement:  03/11/19 Potential to Achieve Goals: Good    Frequency BID   Barriers to discharge        Co-evaluation               AM-PAC PT "6 Clicks" Mobility  Outcome Measure Help needed turning from your back to your side while in a flat bed without using bedrails?: None Help needed moving from lying on your back to sitting on the side of a flat bed without using bedrails?: None Help needed moving to and from a bed to a chair (including a wheelchair)?: A Little Help needed standing up from a chair using your arms (e.g., wheelchair or bedside chair)?: A Little Help needed to walk in hospital room?: A Little Help needed climbing 3-5 steps with a railing? : A Lot 6 Click Score: 19    End of Session Equipment Utilized During Treatment: Gait belt Activity Tolerance: Patient tolerated treatment well Patient left: in chair;with call bell/phone within reach;with chair alarm set;with nursing/sitter in room;with SCD's reapplied;Other (comment)(towel roll under heel, polar care in place) Nurse Communication: Other (comment)(Mobility status written on dry erase board) PT Visit Diagnosis: Muscle weakness (generalized) (M62.81);Other abnormalities of gait and mobility (R26.89);Difficulty in walking, not elsewhere classified (R26.2)    Time: 0347-4259 PT Time Calculation (min) (ACUTE ONLY): 26 min   Charges:   PT Evaluation $PT Eval Low Complexity: 1 Low PT Treatments $Therapeutic Exercise: 8-22 mins  Sharalyn InkJason D Thorn Cox PT, DPT, GCS   Jeremy Cox 02/25/2019, 5:16 PM

## 2019-02-25 NOTE — Anesthesia Procedure Notes (Signed)
Spinal  Patient location during procedure: OR Start time: 02/25/2019 7:35 AM End time: 02/25/2019 7:38 AM Staffing Resident/CRNA: Bernardo Heater, CRNA Performed: resident/CRNA  Preanesthetic Checklist Completed: patient identified, site marked, surgical consent, pre-op evaluation, timeout performed, IV checked, risks and benefits discussed and monitors and equipment checked Spinal Block Patient position: sitting Prep: ChloraPrep Patient monitoring: heart rate, continuous pulse ox, blood pressure and cardiac monitor Approach: midline Location: L3-4 Injection technique: single-shot Needle Needle type: Introducer and Pencan  Needle gauge: 24 G Needle length: 9 cm Additional Notes Negative paresthesia. Negative blood return. Positive free-flowing CSF. Expiration date of kit checked and confirmed. Patient tolerated procedure well, without complications.

## 2019-02-25 NOTE — TOC Progression Note (Signed)
Transition of Care [facility]) - Progression Note    Patient Details  Name: Ramadan Couey MRN: [id number] Date of Birth: May 22, 1965  Transition of Care [facility]) CM/SW [place], RN Phone Number: 02/25/2019, 1:41 PM  Clinical Narrative:    Requested Lovenox price        Expected Discharge Plan and Services                                                 Social Determinants of Health (SDOH) Interventions    Readmission Risk Interventions No flowsheet data found.

## 2019-02-25 NOTE — TOC Benefit Eligibility Note (Signed)
Transition of Care [facility]) Benefit Eligibility Note    Patient Details  Name: Jeremy Cox MRN: [id number] Date of Birth: 1965-12-01   Medication/Dose: Enoxaparin 40 mg daily x 14 days  Covered?: Yes(Generic - Enoxaparin)     Prescription Coverage [facility]erred [facility] where pt[facility] gets other medications filled  Spoke with Person/Company/Phone Number:: Gregary Signs [facility], [phone]  Co-Pay: $104.67 - she said this is an extimated cost  Prior Approval: No  Deductible: Unmet  Additional Notes: Lovenox not on formualry    [place] Phone Number: 02/25/2019, 2:34 PM

## 2019-02-25 NOTE — Anesthesia Preprocedure Evaluation (Signed)
Anesthesia Evaluation  Patient identified by MRN, date of birth, ID band Patient awake    Reviewed: Allergy & Precautions, H&P , NPO status , Patient's Chart, lab work & pertinent test results  History of Anesthesia Complications Negative for: history of anesthetic complications  Airway Mallampati: II  TM Distance: >3 FB Neck ROM: full    Dental  (+) Chipped, Poor Dentition, Missing, Partial Upper   Pulmonary neg pulmonary ROS, neg shortness of breath,           Cardiovascular Exercise Tolerance: Good hypertension, (-) angina(-) Past MI and (-) DOE      Neuro/Psych negative neurological ROS  negative psych ROS   GI/Hepatic negative GI ROS, Neg liver ROS, neg GERD  ,  Endo/Other  negative endocrine ROS  Renal/GU      Musculoskeletal   Abdominal   Peds  Hematology negative hematology ROS (+)   Anesthesia Other Findings Past Medical History: No date: Hypertension  Past Surgical History: No date: NO PAST SURGERIES  BMI    Body Mass Index: 28.30 kg/m      Reproductive/Obstetrics negative OB ROS                             Anesthesia Physical Anesthesia Plan  ASA: II  Anesthesia Plan: Spinal   Post-op Pain Management:    Induction:   PONV Risk Score and Plan:   Airway Management Planned: Natural Airway and Nasal Cannula  Additional Equipment:   Intra-op Plan:   Post-operative Plan:   Informed Consent: I have reviewed the patients History and Physical, chart, labs and discussed the procedure including the risks, benefits and alternatives for the proposed anesthesia with the patient or authorized representative who has indicated his/her understanding and acceptance.     Dental Advisory Given  Plan Discussed with: Anesthesiologist, CRNA and Surgeon  Anesthesia Plan Comments: (Patient reports no bleeding problems and no anticoagulant use.  Plan for spinal with backup  GA  Patient consented for risks of anesthesia including but not limited to:  - adverse reactions to medications - risk of bleeding, infection, nerve damage and headache - risk of failed spinal - damage to teeth, lips or other oral mucosa - sore throat or hoarseness - Damage to heart, brain, lungs or loss of life  Patient voiced understanding.)        Anesthesia Quick Evaluation

## 2019-02-25 NOTE — Transfer of Care (Signed)
Immediate Anesthesia Transfer of Care Note  Patient: Jeremy Cox  Procedure(s) Performed: UNICOMPARTMENTAL KNEE (Right Knee)  Patient Location: PACU  Anesthesia Type:Spinal  Level of Consciousness: sedated  Airway & Oxygen Therapy: Patient Spontanous Breathing and Patient connected to nasal cannula oxygen  Post-op Assessment: Report given to RN and Post -op Vital signs reviewed and stable  Post vital signs: Reviewed and stable  Last Vitals:  Vitals Value Taken Time  BP    Temp    Pulse 72 02/25/19 1003  Resp    SpO2 100 % 02/25/19 1003  Vitals shown include unvalidated device data.  Last Pain:  Vitals:   02/25/19 0616  TempSrc: Tympanic  PainSc: 0-No pain         Complications: No apparent anesthesia complications

## 2019-02-25 NOTE — Op Note (Signed)
02/25/2019  9:43 AM  Patient:   Jeremy Cox  Pre-Op Diagnosis:   Osteochondral defect of medial compartment, right knee.  Post-Op Diagnosis:   Same  Procedure:   Right unicondylar knee arthroplasty.  Surgeon:   Pascal Lux, MD  Assistant:   Cameron Proud, PA-C; Talitha Givens, PA-S  Anesthesia:   Spinal  Findings:   As above.  Complications:   None  EBL:   5 cc  Fluids:   800 cc crystalloid  UOP:   400 cc  TT:   90 minutes at 300 mmHg  Drains:   None  Closure:   Staples  Implants:   All-cemented Biomet Oxford system with a medium femoral component, a "C" sized tibial tray, and a 3 mm meniscal bearing insert.  Brief Clinical Note:   The patient is a 53 year old male with a 1+ year history of medial sided right knee pain. His symptoms have persisted despite medications, activity modification, injections, etc. His history and examination are consistent with an osteochondral defect involving the weightbearing portion of the medial femoral condyle as confirmed by both plain radiographs and MRI scan. The patient presents at this time for a right partial knee replacement.  Procedure:   The patient was brought into the operating room and a spinal placed by the anesthesiologist. The patient was lain in the supine position and a Foley catheter inserted. The patient was repositioned so that the non-surgical leg was placed in a flexed and abducted position in the yellow fin leg holder while the surgical extremity was placed over the Biomet leg holder. The right lower extremity was prepped with ChloraPrep solution before being draped sterilely. Preoperative antibiotics were administered. After performing a timeout to verify the appropriate surgical site, the limb was exsanguinated with an Esmarch and the tourniquet inflated to 300 mmHg. A standard anterior approach to the knee was made through an approximately 3.5-4 inch incision. The incision was carried down through the subcutaneous  tissues to expose the superficial retinaculum. This was split the length the incision and medial flap elevated sufficiently to expose the medial retinaculum. This was incised along the medial border of the patella tendon and extended proximally along the medial border of the patella, leaving a 3-4 mm cuff of tissue. The soft tissues were elevated off the anteromedial aspect of the proximal tibia. The anterior portion of the meniscus was removed after performing a subtotal excision of the infrapatellar fat pad. The anterior cruciate ligament was inspected and found to be in excellent condition. Osteophytes were removed from the inferior pole of the patella as well as from the notch using a quarter-inch osteotome. There were significant degenerative changes of both the femur and tibia on the medial side. The medial femoral condyle was sized using the small and medium sizers. It was felt that the medium guide best optimized the contour of the femur. This was left in place and the external tibial guide positioned. The coupling device was used to connect the guide to the medial femoral condylar sizer to optimize appropriate orientation. Two guide pins were inserted into the cutting block before the coupling device and sizer were removed. The appropriate tibial cut was made using the oscillating and reciprocating saws. The piece was removed in its entirety and taken to the back table where it was sized and found to be optimally replicated by a "C" sized component. The 7 mm spacer was inserted to verify that sufficient bone had been removed.  Attention was directed to femoral  side. The intramedullary canal was accessed through a 4 mm drill hole. The intramedullary guide was positioned before the guide for the femoral condylar holes was positioned. The appropriate coupling device connected this guide to the intramedullary guide before both drill holes were created in the distal aspect of the medial femoral condyle. The  devices were removed and the posterior condylar cutting block inserted. The appropriate cut was made using the reciprocating saw and this piece removed. The #0 spigot was inserted and the initial bone milling performed. A trial femoral component was inserted and both the flexion and extension gaps measured. In flexion, the gap measured 7 mm whereas in extension, it measured 3 mm. Therefore, the #4 spigot was selected and the secondary bone milling performed. Repeat sizing demonstrated symmetric flexion and extension gaps. The bone was removed from the postero-medial and postero-lateral aspects of the femoral condyle, as well as from the beneath the collar of the spigot. Bone also was removed from the anterior portion of the femur so as to minimize any potential impingement with the meniscal bearing insert. The trial components removed and several drill holes placed into the distal femoral condyle to further augment cement fixation.  Attention was redirected to the tibial side. The "C" sized tibial tray was positioned and temporarily secured using the appropriate spiked nail. The keel was created using the bi-bladed reciprocating saw and hoe. The keeled "C" sized trial tibial tray was inserted to be sure that it seated properly. At this point, a total of 20 cc of Exparel diluted out to 60 cc with normal saline and 30 cc of 0.5% Sensorcaine was injected in and around the posterior and medial capsular tissues, as well as the peri-incisional tissues to help with postoperative pain control.  The bony surfaces were prepared for cementing by irrigating them thoroughly with bacitracin saline solution using the jet lavage system before packing them with a dry Ray-Tec sponge. Meanwhile, cement was being mixed on the back table. When the cement was ready, the tibial tray was cemented in first. The excess cement was removed using a Public house manager after impacting it into place. Next, the femoral component was impacted into  place. Again the excess cement was removed using a Public house manager. The 4 mm spacer was inserted and the knee brought into near full extension while the cement hardened. Once the cement hardened, the spacer was removed and the 3 mm meniscal bearing insert was trialed. This demonstrated excellent tracking while the knee was placed through a range of motion, and showed no evidence towards subluxation or dislocation. In addition, it did not fit too tightly. Therefore, the permanent 3 mm meniscal bearing insert was snapped into position after verifying that no cement had been retained posteriorly. Again the knee was placed through a range of motion with the findings as described above.  The wound was copiously irrigated with sterile saline solution via the jet lavage system before the retinacular layer was reapproximated using #0 Vicryl interrupted sutures. At this point, 1 g of transexemic acid in 10 cc of normal saline was injected intra-articularly. The subcutaneous tissues were closed in two layers using 2-0 Vicryl interrupted sutures before the skin was closed using staples. A sterile occlusive dressing was applied to the knee before the patient was awakened. The patient was transferred back to his/her hospital bed and returned to the recovery room in satisfactory condition after tolerating the procedure well. A Polar Care device was applied to the knee as well.

## 2019-02-25 NOTE — H&P (Signed)
Paper H&P to be scanned into permanent record. H&P reviewed and patient re-examined. No changes. 

## 2019-02-25 NOTE — Anesthesia Post-op Follow-up Note (Signed)
Anesthesia QCDR form completed.        

## 2019-02-26 LAB — BASIC METABOLIC PANEL
Anion gap: 7 (ref 5–15)
BUN: 13 mg/dL (ref 6–20)
CO2: 25 mmol/L (ref 22–32)
Calcium: 8.8 mg/dL — ABNORMAL LOW (ref 8.9–10.3)
Chloride: 106 mmol/L (ref 98–111)
Creatinine, Ser: 1.21 mg/dL (ref 0.61–1.24)
GFR calc Af Amer: >60 mL/min (ref >60)
GFR calc non Af Amer: >60 mL/min (ref >60)
Glucose, Bld: 118 mg/dL — ABNORMAL HIGH (ref 70–99)
Potassium: 4.1 mmol/L (ref 3.5–5.1)
Sodium: 138 mmol/L (ref 135–145)

## 2019-02-26 MED ORDER — HYDROCODONE-ACETAMINOPHEN 5-325 MG PO TABS: 1.0000 | ORAL_TABLET | ORAL | 0 refills | Status: DC | PRN

## 2019-02-26 MED ORDER — TRAMADOL HCL 50 MG PO TABS: 50.0000 mg | ORAL_TABLET | Freq: Four times a day (QID) | ORAL | 0 refills | Status: DC | PRN

## 2019-02-26 MED ORDER — ENOXAPARIN SODIUM 40 MG/0.4ML ~~LOC~~ SOLN: 40.0000 mg | SUBCUTANEOUS | 0 refills | Status: DC

## 2019-02-26 NOTE — Progress Notes (Signed)
Physical Therapy Treatment Patient Details Name: Jeremy Cox MRN: [id number] DOB: 09-Jun-1965 Today's Date: 02/26/2019    History of Present Illness 53 y/o male s/p R unicondylar knee replacement 10/22 without post-op complications. PMH includes HTN    PT Comments    Pt did exceedingly well with all aspects of PT session.  He easily moved in bed and was able to rise to standing w/o assist and w/o much reliance on UEs at all.  He reported no pain on arrival and had only very limited pain during ROM and strength tasks.  Pt with great quad control and good quality of motion and confidence with all tasks.  Pt again doing very well and able to easily circumambulate the nurses' station and negotiate up/down flight of steps w/o issue.  Pt ready for d/c from PT perspective, reviewed HEP and all questions answered.   Follow Up Recommendations  Other (comment);Follow surgeon's recommendation for DC plan and follow-up therapies     Equipment Recommendations  Rolling walker with 5" wheels    Recommendations for Other Services       Precautions / Restrictions Precautions Precautions: Knee Precaution Booklet Issued: Yes (comment) Restrictions RLE Weight Bearing: Weight bearing as tolerated    Mobility  Bed Mobility Overal bed mobility: Independent             General bed mobility comments: Good speed and sequencing  Transfers Overall transfer level: Independent   Transfers: Sit to/from Stand Sit to Stand: Modified independent (Device/Increase time)         General transfer comment: Pt is able to rise to standing w/o assist and maintaned balance w/o AD  Ambulation/Gait Ambulation/Gait assistance: Min guard Gait Distance (Feet): 250 Feet Assistive device: Rolling walker (2 wheeled)       General Gait Details: Pt was able to quickly and confidently attain and maintain consistent cadence and speed.  He was able to ambulate with only single UE on rail for ~25 ft with relative  ease as well. Encouraged pt to not over-do-it regarding walking w/o UE support initially at home.  Pt overall did very well and had no safety issues.    Stairs Stairs: Yes Stairs assistance: Modified independent (Device/Increase time) Stair Management: One rail Left Number of Stairs: 14 General stair comments: Pt was able to confidently negotiate up/down flight of steps w/o issue.  He was able to take a few reciprocal steps, but PT encouraged him to just lead with the L for now and he did better with more confidence doing this.  Pt safe and confident however with all tasks.   Wheelchair Mobility    Modified Rankin (Stroke Patients Only)       Balance Overall balance assessment: Needs assistance Sitting-balance support: No upper extremity supported Sitting balance-Leahy Scale: Good     Standing balance support: Bilateral upper extremity supported;No upper extremity supported Standing balance-Leahy Scale: Good Standing balance comment: Pt able to maintain balance confidently with and w/o UEs                             Cognition Arousal/Alertness: Awake/alert Behavior During Therapy: WFL for tasks assessed/performed Overall Cognitive Status: Within Functional Limits for tasks assessed                                        Exercises Total Joint Exercises Quad  Sets: Strengthening;15 reps Short Arc Quad: Strengthening;15 reps Heel Slides: Strengthening;10 reps Hip ABduction/ADduction: Strengthening;15 reps Straight Leg Raises: Strengthening;15 reps Knee Flexion: PROM;5 reps Goniometric ROM: 0-103    General Comments        Pertinent Vitals/Pain Pain Assessment: No/denies pain(minimal increased pain with ROM, no pain at rest)    Home Living                      Prior Function            PT Goals (current goals can now be found in the care plan section) Progress towards PT goals: Progressing toward goals    Frequency     BID      PT Plan Current plan remains appropriate    Co-evaluation              AM-PAC PT "6 Clicks" Mobility   Outcome Measure  Help needed turning from your back to your side while in a flat bed without using bedrails?: None Help needed moving from lying on your back to sitting on the side of a flat bed without using bedrails?: None Help needed moving to and from a bed to a chair (including a wheelchair)?: None Help needed standing up from a chair using your arms (e.g., wheelchair or bedside chair)?: None Help needed to walk in hospital room?: None Help needed climbing 3-5 steps with a railing? : None 6 Click Score: 24    End of Session Equipment Utilized During Treatment: Gait belt Activity Tolerance: Patient tolerated treatment well Patient left: with call bell/phone within reach;with chair alarm set   PT Visit Diagnosis: Muscle weakness (generalized) (M62.81);Other abnormalities of gait and mobility (R26.89);Difficulty in walking, not elsewhere classified (R26.2)     Time: 0825-0909 PT Time Calculation (min) (ACUTE ONLY): 44 min  Charges:  $Gait Training: 8-22 mins $Therapeutic Exercise: 8-22 mins $Therapeutic Activity: 8-22 mins                     Kreg Shropshire, DPT 02/26/2019, 10:47 AM

## 2019-02-26 NOTE — Discharge Instructions (Signed)
Diet: As you were doing prior to hospitalization   Shower:  May shower but keep the wounds dry, use an occlusive plastic wrap, NO SOAKING IN TUB.  If the bandage gets wet, change with a clean dry gauze.  Dressing:  You may change your dressing as needed. Change the dressing with sterile gauze dressing.    Activity:  Increase activity slowly as tolerated, but follow the weight bearing instructions below.  No lifting or driving for 6 weeks.  Weight Bearing:   Weightbearing as tolerated to the right knee.  To prevent constipation: you may use a stool softener such as -  Colace (over the counter) 100 mg by mouth twice a day  Drink plenty of fluids (prune juice may be helpful) and high fiber foods Miralax (over the counter) for constipation as needed.    Itching:  If you experience itching with your medications, try taking only a single pain pill, or even half a pain pill at a time.  You may take up to 10 pain pills per day, and you can also use benadryl over the counter for itching or also to help with sleep.   Precautions:  If you experience chest pain or shortness of breath - call 911 immediately for transfer to the hospital emergency department!!  If you develop a fever greater that 101 F, purulent drainage from wound, increased redness or drainage from wound, or calf pain-Call [place]                                              Follow- Up Appointment:  Please call for an appointment to be seen in 2 weeks at [facility]

## 2019-02-26 NOTE — Anesthesia Postprocedure Evaluation (Signed)
Anesthesia Post Note  Patient: Jeremy Cox  Procedure(s) Performed: UNICOMPARTMENTAL KNEE (Right Knee)  Patient location during evaluation: Nursing Unit Anesthesia Type: Spinal Level of consciousness: oriented and awake and alert Pain management: pain level controlled Vital Signs Assessment: post-procedure vital signs reviewed and stable Respiratory status: spontaneous breathing and respiratory function stable Cardiovascular status: blood pressure returned to baseline and stable Postop Assessment: no headache, no backache, no apparent nausea or vomiting, patient able to bend at knees and able to ambulate Anesthetic complications: no     Last Vitals:  Vitals:   02/26/19 0322 02/26/19 0742  BP: 123/77 123/80  Pulse: 61 (!) 59  Resp: 16   Temp: 37.1 C 36.8 C  SpO2: 97% 97%    Last Pain:  Vitals:   02/26/19 0742  TempSrc: Oral  PainSc:                  Caryl Asp

## 2019-02-26 NOTE — TOC Transition Note (Addendum)
Transition of Care [facility]) - CM/SW Discharge Note   Patient Details  Name: Jeremy Cox MRN: [id number] Date of Birth: March 30, 1966  [facility] CM/SW Contact:  Alivya Wegman, Lenice Llamas Phone Number: [phone]  02/26/2019, 11:42 AM   Clinical Narrative: Clinical Social Worker (CSW) met with patient to discuss D/C plan. Patient was alert and oriented X4 and was sitting up in the char. PT is recommending per surgeon's recommendation. CSW introduced self and explained role of CSW department. Per patient he lives in [place] with his significant other Jeremy Cox. Patient reported that he prefers to do outpatient PT at [facility] in [place]. Patient reported that he has an outpatient PT appointment on Nov, 5th. CSW contacted [facility] and was able to move the outpatient PT appointment to Monday 10/26 at 10:30 am. Patient is aware of new appointment time. CSW sent surgeon and PA a message making them aware of above. Patient asked about a walker and a cane. CSW made patient aware that PT is recommending a walker right now. Patient verbalized his understanding and requested a walker. [employer] agency representative is aware of above. Patient reported that he does not need a bedside commode. CSW notified patient of his Lovenox price $104.67 and provided patient with a good Rx card. Patient reported that he can afford his Lovenox. CSW explained that if patient uses the Good Rx card it will not go towards his deductible. Patient verbalized his understanding. Per patient he is ready to D/C home today and reported that Jeremy Cox will pick him up. RN aware of above. Please reconsult if future social work needs arise. CSW signing off.     Final next level of care: OP Rehab Barriers to Discharge: Barriers Resolved   Patient Goals and CMS Choice Patient states their goals for this hospitalization and ongoing recovery are:: To go home.      Discharge Placement                        Discharge Plan and Services In-house Referral: Clinical Social Work              DME Arranged: Gilford Rile rolling DME Agency: [employer] Date DME Agency Contacted: 02/26/19 Time DME Agency Contacted: 41 Representative spoke with at DME Agency: [place] Arranged: NA          Social Determinants of Health ([place]) Interventions     Readmission Risk Interventions No flowsheet data found.

## 2019-02-26 NOTE — Progress Notes (Signed)
Discharge instructions reviewed with pt. He verbalized understanding of instructions. Prescriptions placed in packet and packet was given to pt.

## 2019-02-26 NOTE — Discharge Summary (Signed)
Physician Discharge Summary  Patient ID: Jeremy Cox MRN: [id number] DOB/AGE: January 13, 1966 53 y.o.  Admit date: 02/25/2019 Discharge date: 02/26/2019  Admission Diagnoses:  Status post right partial knee replacement [Z96.651]  Discharge Diagnoses: Patient Active Problem List   Diagnosis Date Noted  . Status post right partial knee replacement 02/25/2019    Past Medical History:  Diagnosis Date  . Hypertension      Transfusion: None.   Consultants (if any):   Discharged Condition: Improved  Hospital Course: Danile Trier is an 53 y.o. 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    Surgeries: Procedure(s): UNICOMPARTMENTAL KNEE on 02/25/2019 Patient tolerated the surgery well. Taken to PACU where she was stabilized and then transferred to the orthopedic floor.  Started on Lovenox 40mg  q 24 hrs. Foot pumps applied bilaterally at 80 mm. Heels elevated on bed with rolled towels. No evidence of DVT. Negative Homan. Physical therapy started on day #1 for gait training and transfer. OT started day #1 for ADL and assisted devices.  Patient's IV and foley were removed on POD1.  Implants: All-cemented [employer] [place] system with a medium femoral component, a "C" sized tibial tray, and a 3 mm meniscal bearing insert.  He was given perioperative antibiotics:  Anti-infectives (From admission, onward)   Start     Dose/Rate Route Frequency Ordered Stop   02/25/19 1400  ceFAZolin (ANCEF) IVPB 2g/100 mL premix     2 g 200 mL/hr over 30 Minutes Intravenous Every 6 hours 02/25/19 1310 02/26/19 0156   02/25/19 0650  ceFAZolin (ANCEF) 2-4 GM/100ML-% IVPB    Note to Pharmacy: 02/27/19   : cabinet override      02/25/19 0650 02/25/19 0751   02/24/19 2145  ceFAZolin (ANCEF) IVPB 2g/100 mL premix     2 g 200 mL/hr over 30 Minutes  Intravenous  Once 02/24/19 2143 02/25/19 0803    .  He was given sequential compression devices, early ambulation, and Lovenox for DVT prophylaxis.  He benefited maximally from the hospital stay and there were no complications.    Recent vital signs:  Vitals:   02/26/19 0322 02/26/19 0742  BP: 123/77 123/80  Pulse: 61 (!) 59  Resp: 16   Temp: 98.8 F (37.1 C) 98.2 F (36.8 C)  SpO2: 97% 97%    Recent laboratory studies:  Lab Results  Component Value Date   HGB 11.9 (L) 02/18/2019   Lab Results  Component Value Date   WBC 5.0 02/18/2019   PLT 296 02/18/2019   No results found for: INR Lab Results  Component Value Date   NA 138 02/26/2019   K 4.1 02/26/2019   CL 106 02/26/2019   CO2 25 02/26/2019   BUN 13 02/26/2019   CREATININE 1.21 02/26/2019   GLUCOSE 118 (H) 02/26/2019    Discharge Medications:   Allergies as of 02/26/2019   No Known Allergies     Medication List    TAKE these medications   amLODipine 5 MG tablet Commonly known as: NORVASC Take 5 mg by mouth daily.   enoxaparin 40 MG/0.4ML injection Commonly known as: LOVENOX Inject 0.4 mLs (40 mg total) into the skin daily.   HYDROcodone-acetaminophen 5-325 MG tablet Commonly known as: NORCO/VICODIN Take 1-2 tablets by mouth every 4 (four) hours as needed for moderate pain.   multivitamin with minerals  Tabs tablet Take 1 tablet by mouth daily.   sildenafil 100 MG tablet Commonly known as: VIAGRA Take 100 mg by mouth daily as needed for erectile dysfunction.   traMADol 50 MG tablet Commonly known as: ULTRAM Take 1 tablet (50 mg total) by mouth every 6 (six) hours as needed for moderate pain.   vitamin C 100 MG tablet Take 100 mg by mouth daily.            Durable Medical Equipment  (From admission, onward)         Start     Ordered   02/25/19 1311  DME Bedside commode  Once    Question:  Patient needs a bedside commode to treat with the following condition  Answer:  Status post  right partial knee replacement   02/25/19 1310   02/25/19 1311  DME 3 n 1  Once     02/25/19 1310   02/25/19 1311  DME Walker rolling  Once    Question:  Patient needs a walker to treat with the following condition  Answer:  Status post right partial knee replacement   02/25/19 1310         Diagnostic Studies: Dg Knee Right Port  Result Date: 02/25/2019 CLINICAL DATA:  Status post partial knee replacement EXAM: PORTABLE RIGHT KNEE - 1-2 VIEW COMPARISON:  None. FINDINGS: Partial knee replacement is noted medially. No acute bony or soft tissue abnormality is seen. IMPRESSION: Status post partial knee replacement Electronically Signed   By: Inez Catalina M.D.   On: 02/25/2019 10:46   Disposition: Plan for discharge home this afternoon pending morning session of PT.  Follow-up Information    Lattie Corns, PA-C Follow up in 14 day(s).   Specialty: Physician Assistant Why: Electa Sniff information: [place] [address] [phone]          Signed: Judson Roch PA-C 02/26/2019, 7:57 AM

## 2019-02-26 NOTE — Progress Notes (Signed)
  Subjective: 1 Day Post-Op Procedure(s) (LRB): UNICOMPARTMENTAL KNEE (Right) Patient reports pain as mild.   Patient is well, and has had no acute complaints or problems Plan is to go Home after hospital stay. Negative for chest pain and shortness of breath Fever: no Gastrointestinal:Negative for nausea and vomiting  Objective: Vital signs in last 24 hours: Temp:  [97 F (36.1 C)-98.8 F (37.1 C)] 98.2 F (36.8 C) (10/23 0742) Pulse Rate:  [48-72] 59 (10/23 0742) Resp:  [13-18] 16 (10/23 0322) BP: (106-150)/(70-93) 123/80 (10/23 0742) SpO2:  [97 %-100 %] 97 % (10/23 0742) FiO2 (%):  [21 %] 21 % (10/22 1311)  Intake/Output from previous day:  Intake/Output Summary (Last 24 hours) at 02/26/2019 0753 Last data filed at 02/25/2019 1644 Gross per 24 hour  Intake 1058.32 ml  Output 1305 ml  Net -246.68 ml    Intake/Output this shift: No intake/output data recorded.  Labs: No results for input(s): HGB in the last 72 hours. No results for input(s): WBC, RBC, HCT, PLT in the last 72 hours. Recent Labs    02/26/19 0441  NA 138  K 4.1  CL 106  CO2 25  BUN 13  CREATININE 1.21  GLUCOSE 118*  CALCIUM 8.8*   No results for input(s): LABPT, INR in the last 72 hours.   EXAM General - Patient is Alert, Appropriate and Oriented Extremity - ABD soft Neurovascular intact Sensation intact distally Intact pulses distally Dorsiflexion/Plantar flexion intact Incision: dressing C/D/I No cellulitis present Dressing/Incision - clean, dry, no drainage Motor Function - intact, moving foot and toes well on exam.   Past Medical History:  Diagnosis Date  . Hypertension     Assessment/Plan: 1 Day Post-Op Procedure(s) (LRB): UNICOMPARTMENTAL KNEE (Right) Active Problems:   Status post right partial knee replacement  Estimated body mass index is 28.3 kg/m as calculated from the following:   Height as of this encounter: 5' 11.5" (1.816 m).   Weight as of this encounter: 93.3  kg. Advance diet Up with therapy D/C IV fluids when tolerating po intake.  Labs reviewed this AM.   Is urinating and passing gas without pain. Up with therapy today. Plan for d/c home this afternoon.  DVT Prophylaxis - Lovenox, Foot Pumps and TED hose Weight-Bearing as tolerated to right leg  J. Cameron Proud, PA-C [facility] Orthopa[facility] Surgery 02/26/2019, 7:53 AM

## 2019-04-12 ENCOUNTER — Other Ambulatory Visit: Payer: Self-pay | Admitting: Gastroenterology

## 2019-04-12 DIAGNOSIS — R140 Abdominal distension (gaseous): Secondary | ICD-10-CM

## 2019-04-19 ENCOUNTER — Ambulatory Visit: Admission: RE | Admit: 2019-04-19 | Payer: PRIVATE HEALTH INSURANCE | Source: Ambulatory Visit

## 2019-04-20 ENCOUNTER — Ambulatory Visit: Admission: RE | Admit: 2019-04-20 | Payer: PRIVATE HEALTH INSURANCE | Source: Ambulatory Visit

## 2019-04-23 ENCOUNTER — Ambulatory Visit: Admission: RE | Admit: 2019-04-23 | Payer: PRIVATE HEALTH INSURANCE | Source: Ambulatory Visit

## 2019-07-27 ENCOUNTER — Other Ambulatory Visit: Payer: Self-pay

## 2019-07-27 ENCOUNTER — Other Ambulatory Visit
Admission: RE | Admit: 2019-07-27 | Discharge: 2019-07-27 | Disposition: A | Discharge status: Home / Self Care | Payer: PRIVATE HEALTH INSURANCE | Source: Ambulatory Visit | Attending: Internal Medicine | Admitting: Internal Medicine

## 2019-07-27 DIAGNOSIS — Z1211 Encounter for screening for malignant neoplasm of colon: Secondary | ICD-10-CM | POA: Diagnosis not present

## 2019-07-27 DIAGNOSIS — Z79899 Other long term (current) drug therapy: Secondary | ICD-10-CM | POA: Diagnosis not present

## 2019-07-27 DIAGNOSIS — N529 Male erectile dysfunction, unspecified: Secondary | ICD-10-CM | POA: Diagnosis not present

## 2019-07-27 DIAGNOSIS — Z20822 Contact with and (suspected) exposure to covid-19: Secondary | ICD-10-CM | POA: Diagnosis not present

## 2019-07-27 DIAGNOSIS — I10 Essential (primary) hypertension: Secondary | ICD-10-CM | POA: Diagnosis not present

## 2019-07-27 DIAGNOSIS — J45909 Unspecified asthma, uncomplicated: Secondary | ICD-10-CM | POA: Diagnosis not present

## 2019-07-27 DIAGNOSIS — Z96651 Presence of right artificial knee joint: Secondary | ICD-10-CM | POA: Diagnosis not present

## 2019-07-27 DIAGNOSIS — Z7901 Long term (current) use of anticoagulants: Secondary | ICD-10-CM | POA: Diagnosis not present

## 2019-07-27 LAB — SARS CORONAVIRUS 2 (TAT 6-24 HRS): SARS Coronavirus 2: NEGATIVE

## 2019-07-28 ENCOUNTER — Encounter: Payer: Self-pay | Admitting: Internal Medicine

## 2019-07-29 ENCOUNTER — Other Ambulatory Visit: Payer: Self-pay

## 2019-07-29 ENCOUNTER — Ambulatory Visit: Payer: PRIVATE HEALTH INSURANCE | Admitting: Anesthesiology

## 2019-07-29 ENCOUNTER — Ambulatory Visit
Admission: RE | Admit: 2019-07-29 | Discharge: 2019-07-29 | Disposition: A | Discharge status: Home / Self Care | Payer: PRIVATE HEALTH INSURANCE | Attending: Internal Medicine | Admitting: Internal Medicine

## 2019-07-29 ENCOUNTER — Encounter
Admission: RE | Disposition: A | Discharge status: Home / Self Care | Payer: Self-pay | Source: Home / Self Care | Attending: Internal Medicine

## 2019-07-29 ENCOUNTER — Encounter: Payer: Self-pay | Admitting: Internal Medicine

## 2019-07-29 DIAGNOSIS — Z96651 Presence of right artificial knee joint: Secondary | ICD-10-CM | POA: Insufficient documentation

## 2019-07-29 DIAGNOSIS — Z7901 Long term (current) use of anticoagulants: Secondary | ICD-10-CM | POA: Insufficient documentation

## 2019-07-29 DIAGNOSIS — Z1211 Encounter for screening for malignant neoplasm of colon: Secondary | ICD-10-CM | POA: Insufficient documentation

## 2019-07-29 DIAGNOSIS — Z79899 Other long term (current) drug therapy: Secondary | ICD-10-CM | POA: Insufficient documentation

## 2019-07-29 DIAGNOSIS — J45909 Unspecified asthma, uncomplicated: Secondary | ICD-10-CM | POA: Insufficient documentation

## 2019-07-29 DIAGNOSIS — N529 Male erectile dysfunction, unspecified: Secondary | ICD-10-CM | POA: Insufficient documentation

## 2019-07-29 DIAGNOSIS — Z20822 Contact with and (suspected) exposure to covid-19: Secondary | ICD-10-CM | POA: Insufficient documentation

## 2019-07-29 DIAGNOSIS — I10 Essential (primary) hypertension: Secondary | ICD-10-CM | POA: Insufficient documentation

## 2019-07-29 HISTORY — DX: Unspecified asthma, uncomplicated: J45.909

## 2019-07-29 HISTORY — PX: COLONOSCOPY WITH PROPOFOL: SHX5780

## 2019-07-29 SURGERY — COLONOSCOPY WITH PROPOFOL
Anesthesia: General

## 2019-07-29 MED ORDER — SODIUM CHLORIDE 0.9 % IV SOLN: INTRAVENOUS | Status: DC

## 2019-07-29 MED ORDER — MIDAZOLAM HCL 2 MG/2ML IJ SOLN
INTRAMUSCULAR | Status: DC | PRN
  Administered 2019-07-29: 2 mg via INTRAVENOUS

## 2019-07-29 MED ORDER — FENTANYL CITRATE (PF) 100 MCG/2ML IJ SOLN
INTRAMUSCULAR | Status: DC | PRN
  Administered 2019-07-29: 50 ug via INTRAVENOUS

## 2019-07-29 MED ORDER — MIDAZOLAM HCL 2 MG/2ML IJ SOLN
INTRAMUSCULAR | Status: AC
  Filled 2019-07-29: qty 2

## 2019-07-29 MED ORDER — FENTANYL CITRATE (PF) 100 MCG/2ML IJ SOLN
INTRAMUSCULAR | Status: AC
  Filled 2019-07-29: qty 2

## 2019-07-29 MED ORDER — PROPOFOL 500 MG/50ML IV EMUL
INTRAVENOUS | Status: DC | PRN
  Administered 2019-07-29: 120 ug/kg/min via INTRAVENOUS

## 2019-07-29 MED ORDER — PROPOFOL 500 MG/50ML IV EMUL
INTRAVENOUS | Status: AC
  Filled 2019-07-29: qty 50

## 2019-07-29 NOTE — Anesthesia Preprocedure Evaluation (Signed)
Anesthesia Evaluation  Patient identified by MRN, date of birth, ID band Patient awake    Reviewed: Allergy & Precautions, H&P , NPO status , Patient's Chart, lab work & pertinent test results, reviewed documented beta blocker date and time   Airway Mallampati: II   Neck ROM: full    Dental  (+) Poor Dentition   Pulmonary neg pulmonary ROS,    Pulmonary exam normal        Cardiovascular Exercise Tolerance: Good hypertension, On Medications negative cardio ROS Normal cardiovascular exam Rhythm:regular Rate:Normal     Neuro/Psych negative neurological ROS  negative psych ROS   GI/Hepatic negative GI ROS, Neg liver ROS,   Endo/Other  negative endocrine ROS  Renal/GU negative Renal ROS  negative genitourinary   Musculoskeletal   Abdominal   Peds  Hematology negative hematology ROS (+)   Anesthesia Other Findings Past Medical History: 2018: HSV (herpes simplex virus) infection No date: Hypertension No date: Reactive airway disease Past Surgical History: No date: NO PAST SURGERIES 02/25/2019: PARTIAL KNEE ARTHROPLASTY; Right     Comment:  Procedure: UNICOMPARTMENTAL KNEE;  Surgeon: Christena Flake, MD;  Location: [facility] ORS;  Service: Orthopedics;                Laterality: Right; No date: VASECTOMY   Reproductive/Obstetrics negative OB ROS                             Anesthesia Physical Anesthesia Plan  ASA: II  Anesthesia Plan: General   Post-op Pain Management:    Induction:   PONV Risk Score and Plan:   Airway Management Planned:   Additional Equipment:   Intra-op Plan:   Post-operative Plan:   Informed Consent: I have reviewed the patients History and Physical, chart, labs and discussed the procedure including the risks, benefits and alternatives for the proposed anesthesia with the patient or authorized representative who has indicated his/her  understanding and acceptance.     Dental Advisory Given  Plan Discussed with: CRNA  Anesthesia Plan Comments:         Anesthesia Quick Evaluation

## 2019-07-29 NOTE — Op Note (Signed)
[facility] Gastroenterology Patient Name: Jeremy Cox Procedure Date: 07/29/2019 10:33 AM MRN: [id number] Account #: [id number] Date of Birth: 1966/03/19 Admit Type: Outpatient Age: 54 Room: [facility] ENDO ROOM 3 Gender: Male Note Status: Finalized Procedure:             Colonoscopy Indications:           Screening for colorectal malignant neoplasm Providers:             Boykin Nearing. Norma Fredrickson MD, MD Referring MD:          Caryl Asp (Referring MD) Medicines:             Propofol per Anesthesia Complications:         No immediate complications. Procedure:             Pre-Anesthesia Assessment:                        - The risks and benefits of the procedure and the                         sedation options and risks were discussed with the                         patient. All questions were answered and informed                         consent was obtained.                        - Patient identification and proposed procedure were                         verified prior to the procedure by the nurse. The                         procedure was verified in the procedure room.                        - ASA Grade Assessment: III - A patient with severe                         systemic disease.                        - After reviewing the risks and benefits, the patient                         was deemed in satisfactory condition to undergo the                         procedure.                        After obtaining informed consent, the colonoscope was                         passed under direct vision. Throughout the procedure,                         the patient's blood pressure, pulse,  and oxygen                         saturations were monitored continuously. The                         Colonoscope was introduced through the anus and                         advanced to the the cecum, identified by appendiceal                         orifice and ileocecal valve. The  colonoscopy was                         performed without difficulty. The patient tolerated                         the procedure well. The quality of the bowel                         preparation was good. Findings:      The perianal and digital rectal examinations were normal. Pertinent       negatives include normal sphincter tone and no palpable rectal lesions.      The entire examined colon appeared normal on direct and retroflexion       views. Impression:            - The entire examined colon is normal on direct and                         retroflexion views.                        - No specimens collected. Recommendation:        - Patient has a contact number available for                         emergencies. The signs and symptoms of potential                         delayed complications were discussed with the patient.                         Return to normal activities tomorrow. Written                         discharge instructions were provided to the patient.                        - Resume previous diet.                        - Continue present medications.                        - Repeat colonoscopy in 10 years for screening                         purposes.                        -  Return to GI office PRN.                        - The findings and recommendations were discussed with                         the patient. Procedure Code(s):     --- Professional ---                        O7564, Colorectal cancer screening; colonoscopy on                         individual not meeting criteria for high risk Diagnosis Code(s):     --- Professional ---                        Z12.11, Encounter for screening for malignant neoplasm                         of colon CPT copyright 2019 [employer] Association. All rights reserved. The codes documented in this report are preliminary and upon coder review may  be revised to meet current compliance requirements. Stanton Kidney MD, MD 07/29/2019 10:52:15 AM This report has been signed electronically. Number of Addenda: 0 Note Initiated On: 07/29/2019 10:33 AM Scope Withdrawal Time: 0 hours 4 minutes 55 seconds  Total Procedure Duration: 0 hours 6 minutes 52 seconds  Estimated Blood Loss:  Estimated blood loss: none.      [facility] [address]

## 2019-07-29 NOTE — Interval H&P Note (Signed)
History and Physical Interval Note:  07/29/2019 11:11 AM  Jeremy Cox  has presented today for surgery, with the diagnosis of CCA.  The various methods of treatment have been discussed with the patient and family. After consideration of risks, benefits and other options for treatment, the patient has consented to  Procedure(s): COLONOSCOPY WITH PROPOFOL (N/A) as a surgical intervention.  The patient's history has been reviewed, patient examined, no change in status, stable for surgery.  I have reviewed the patient's chart and labs.  Questions were answered to the patient's satisfaction.     [place], [name]

## 2019-07-29 NOTE — H&P (Signed)
Outpatient short stay form Pre-procedure 07/29/2019 11:11 AM Blenda Wisecup K. Norma Fredrickson, M.D.  Primary Physician: Lenon Oms, NP  Reason for visit:  Colon cancer screening   History of present illness:  Patient presents for colonoscopy for colon cancer screening. The patient denies complaints of abdominal pain, significant change in bowel habits, or rectal bleeding.      Current Facility-Administered Medications:  .  0.9 %  sodium chloride infusion, , Intravenous, Continuous, [place], Boykin Nearing, MD, Last Rate: 200 mL/hr at 07/29/19 1050, Rate Change at 07/29/19 1050  Medications Prior to Admission  Medication Sig Dispense Refill Last Dose  . amLODipine (NORVASC) 5 MG tablet Take 5 mg by mouth daily.   Past Week at Unknown time  . Ascorbic Acid (VITAMIN C) 100 MG tablet Take 100 mg by mouth daily.   Past Week at Unknown time  . Multiple Vitamin (MULTIVITAMIN WITH MINERALS) TABS tablet Take 1 tablet by mouth daily.   Past Week at Unknown time  . enoxaparin (LOVENOX) 40 MG/0.4ML injection Inject 0.4 mLs (40 mg total) into the skin daily. 5.6 mL 0   . HYDROcodone-acetaminophen (NORCO/VICODIN) 5-325 MG tablet Take 1-2 tablets by mouth every 4 (four) hours as needed for moderate pain. (Patient not taking: Reported on 07/29/2019) 60 tablet 0 Not Taking at Unknown time  . sildenafil (VIAGRA) 100 MG tablet Take 100 mg by mouth daily as needed for erectile dysfunction.     . traMADol (ULTRAM) 50 MG tablet Take 1 tablet (50 mg total) by mouth every 6 (six) hours as needed for moderate pain. 40 tablet 0      No Known Allergies   Past Medical History:  Diagnosis Date  . HSV (herpes simplex virus) infection 2018  . Hypertension   . Reactive airway disease     Review of systems:  Otherwise negative.    Physical Exam  Gen: Alert, oriented. Appears stated age.  HEENT: [place]/AT. PERRLA. Lungs: CTA, no wheezes. CV: RR nl S1, S2. Abd: soft, benign, no masses. BS+ Ext: No edema. Pulses  2+    Planned procedures: Proceed with colonoscopy. The patient understands the nature of the planned procedure, indications, risks, alternatives and potential complications including but not limited to bleeding, infection, perforation, damage to internal organs and possible oversedation/side effects from anesthesia. The patient agrees and gives consent to proceed.  Please refer to procedure notes for findings, recommendations and patient disposition/instructions.     Grazia Taffe K. Norma Fredrickson, M.D. Gastroenterology 07/29/2019  11:11 AM

## 2019-07-29 NOTE — Transfer of Care (Signed)
Immediate Anesthesia Transfer of Care Note  Patient: Jeremy Cox  Procedure(s) Performed: COLONOSCOPY WITH PROPOFOL (N/A )  Patient Location: PACU  Anesthesia Type:General  Level of Consciousness: awake and sedated  Airway & Oxygen Therapy: Patient Spontanous Breathing and Patient connected to nasal cannula oxygen  Post-op Assessment: Report given to RN and Post -op Vital signs reviewed and stable  Post vital signs: Reviewed and stable  Last Vitals:  Vitals Value Taken Time  BP    Temp    Pulse    Resp    SpO2      Last Pain:  Vitals:   07/29/19 0951  TempSrc: Temporal  PainSc: 0-No pain         Complications: No apparent anesthesia complications

## 2019-07-30 ENCOUNTER — Encounter: Payer: Self-pay | Admitting: *Deleted

## 2019-07-30 NOTE — Anesthesia Postprocedure Evaluation (Signed)
Anesthesia Post Note  Patient: Jeremy Cox  Procedure(s) Performed: COLONOSCOPY WITH PROPOFOL (N/A )  Patient location during evaluation: PACU Anesthesia Type: General Level of consciousness: awake and alert Pain management: pain level controlled Vital Signs Assessment: post-procedure vital signs reviewed and stable Respiratory status: spontaneous breathing, nonlabored ventilation, respiratory function stable and patient connected to nasal cannula oxygen Cardiovascular status: blood pressure returned to baseline and stable Postop Assessment: no apparent nausea or vomiting Anesthetic complications: no     Last Vitals:  Vitals:   07/29/19 1115 07/29/19 1125  BP: 115/71 130/88  Pulse: (!) 51 (!) 48  Resp: 15 14  Temp:    SpO2: 99% 100%    Last Pain:  Vitals:   07/30/19 0747  TempSrc:   PainSc: 0-No pain                 Yevette Edwards

## 2020-02-15 ENCOUNTER — Other Ambulatory Visit: Payer: Self-pay | Admitting: Surgery

## 2020-02-15 DIAGNOSIS — Z96651 Presence of right artificial knee joint: Secondary | ICD-10-CM

## 2020-02-22 ENCOUNTER — Ambulatory Visit
Admission: RE | Admit: 2020-02-22 | Discharge: 2020-02-22 | Disposition: A | Discharge status: Home / Self Care | Payer: PRIVATE HEALTH INSURANCE | Source: Ambulatory Visit | Attending: Surgery | Admitting: Surgery

## 2020-02-22 ENCOUNTER — Other Ambulatory Visit: Payer: Self-pay

## 2020-02-22 DIAGNOSIS — Z96651 Presence of right artificial knee joint: Secondary | ICD-10-CM | POA: Insufficient documentation

## 2020-04-10 ENCOUNTER — Other Ambulatory Visit: Payer: Self-pay | Admitting: Surgery

## 2020-04-19 ENCOUNTER — Encounter
Admission: RE | Admit: 2020-04-19 | Discharge: 2020-04-19 | Disposition: A | Discharge status: Home / Self Care | Payer: PRIVATE HEALTH INSURANCE | Source: Ambulatory Visit | Attending: Surgery | Admitting: Surgery

## 2020-04-19 ENCOUNTER — Other Ambulatory Visit: Payer: Self-pay

## 2020-04-19 DIAGNOSIS — Z01812 Encounter for preprocedural laboratory examination: Secondary | ICD-10-CM | POA: Insufficient documentation

## 2020-04-19 HISTORY — DX: Gastro-esophageal reflux disease without esophagitis: K21.9

## 2020-04-19 NOTE — Patient Instructions (Addendum)
Your procedure is scheduled on:04-27-20 THURSDAY Report to the Registration Desk on the 1st floor of the [facility]-Then proceed to the 2nd Floor Surgery Desk in the Medical Mall To find out your arrival time, please call [phone] between 1PM - 3PM on:04-26-20 WEDNESDAY  REMEMBER: Instructions that are not followed completely may result in serious medical risk, up to and including death; or upon the discretion of your surgeon and anesthesiologist your surgery may need to be rescheduled.  Do not eat food after midnight the night before surgery.  No gum chewing, lozengers or hard candies.  You may however, drink CLEAR liquids up to 2 hours before you are scheduled to arrive for your surgery. Do not drink anything within 2 hours of your scheduled arrival time.  Clear liquids include: - water  - apple juice without pulp - gatorade - black coffee or tea (Do NOT add milk or creamers to the coffee or tea) Do NOT drink anything that is not on this list.   In addition, your doctor has ordered for you to drink the provided  Ensure Pre-Surgery Clear Carbohydrate Drink  Drinking this carbohydrate drink up to two hours before surgery helps to reduce insulin resistance and improve patient outcomes. Please complete drinking 2 hours prior to scheduled arrival time.  TAKE THESE MEDICATIONS THE MORNING OF SURGERY WITH A SIP OF WATER: -NORVASC (AMLODIPINE)  One week prior to surgery: Stop Anti-inflammatories (NSAIDS) such as Advil, Aleve, Ibuprofen, Motrin, Naproxen, Naprosyn and Aspirin based products such as Excedrin, Goodys Powder, BC Powder-OK TO TAKE TYLENOL IF NEEDED  Stop ANY OVER THE COUNTER supplements until after surgery-STOP VITAMIN C NOW-YOU MAY RESUME AFTER SURGERY (However, you may continue taking multivitamin up until the day before surgery.)  No Alcohol for 24 hours before or after surgery.  No Smoking including e-cigarettes for 24 hours prior to surgery.  No chewable tobacco  products for at least 6 hours prior to surgery.  No nicotine patches on the day of surgery.  Do not use any "recreational" drugs for at least a week prior to your surgery.  Please be advised that the combination of cocaine and anesthesia may have negative outcomes, up to and including death. If you test positive for cocaine, your surgery will be cancelled.  On the morning of surgery brush your teeth with toothpaste and water, you may rinse your mouth with mouthwash if you wish. Do not swallow any toothpaste or mouthwash.  Do not wear jewelry, make-up, hairpins, clips or nail polish.  Do not wear lotions, powders, or perfumes.   Do not shave body from the neck down 48 hours prior to surgery just in case you cut yourself which could leave a site for infection.  Also, freshly shaved skin may become irritated if using the CHG soap.  Contact lenses, hearing aids and dentures may not be worn into surgery.  Do not bring valuables to the hospital. [facility] is not responsible for any missing/lost belongings or valuables.   Use CHG Soap as directed on instruction sheet  Notify your doctor if there is any change in your medical condition (cold, fever, infection).  Wear comfortable clothing (specific to your surgery type) to the hospital.  Plan for stool softeners for home use; pain medications have a tendency to cause constipation. You can also help prevent constipation by eating foods high in fiber such as fruits and vegetables and drinking plenty of fluids as your diet allows.  After surgery, you can help prevent lung  complications by doing breathing exercises.  Take deep breaths and cough every 1-2 hours. Your doctor may order a device called an Incentive Spirometer to help you take deep breaths. When coughing or sneezing, hold a pillow firmly against your incision with both hands. This is called "splinting." Doing this helps protect your incision. It also decreases belly discomfort.  If  you are being admitted to the hospital overnight, leave your suitcase in the car. After surgery it may be brought to your room.  If you are being discharged the day of surgery, you will not be allowed to drive home. You will need a responsible adult (18 years or older) to drive you home and stay with you that night.   If you are taking public transportation, you will need to have a responsible adult (18 years or older) with you. Please confirm with your physician that it is acceptable to use public transportation.   Please call the Pre-admissions Testing Dept. at [phone] if you have any questions about these instructions.  Visitation Policy:  Patients undergoing a surgery or procedure may have one family member or support person with them as long as that person is not COVID-19 positive or experiencing its symptoms.  That person may remain in the waiting area during the procedure.  Inpatient Visitation Update:   In an effort to ensure the safety of our team members and our patients, we are implementing a change to our visitation policy:  Effective Monday, Aug. 9, at 7 a.m., inpatients will be allowed one support person.  o The support person may change daily.  o The support person must pass our screening, gel in and out, and wear a mask at all times, including in the patient's room.  o Patients must also wear a mask when staff or their support person are in the room.  o Masking is required regardless of vaccination status.  Systemwide, no visitors 17 or younger.

## 2020-04-25 ENCOUNTER — Encounter (HOSPITAL_COMMUNITY): Payer: Self-pay | Admitting: Urgent Care

## 2020-04-25 ENCOUNTER — Other Ambulatory Visit: Payer: PRIVATE HEALTH INSURANCE

## 2020-04-25 ENCOUNTER — Encounter
Admission: RE | Admit: 2020-04-25 | Discharge: 2020-04-25 | Disposition: A | Discharge status: Home / Self Care | Payer: PRIVATE HEALTH INSURANCE | Source: Ambulatory Visit | Attending: Surgery | Admitting: Surgery

## 2020-04-25 ENCOUNTER — Other Ambulatory Visit: Payer: Self-pay

## 2020-04-25 DIAGNOSIS — Z20822 Contact with and (suspected) exposure to covid-19: Secondary | ICD-10-CM | POA: Insufficient documentation

## 2020-04-25 DIAGNOSIS — Z01818 Encounter for other preprocedural examination: Secondary | ICD-10-CM | POA: Diagnosis not present

## 2020-04-25 LAB — CBC WITH DIFFERENTIAL/PLATELET
Abs Immature Granulocytes: 0.03 10*3/uL (ref 0.00–0.07)
Basophils Absolute: 0 10*3/uL (ref 0.0–0.1)
Basophils Relative: 1 %
Eosinophils Absolute: 0.2 10*3/uL (ref 0.0–0.5)
Eosinophils Relative: 4 %
HCT: 41.2 % (ref 39.0–52.0)
Hemoglobin: 13 g/dL (ref 13.0–17.0)
Immature Granulocytes: 1 %
Lymphocytes Relative: 31 %
Lymphs Abs: 1.5 10*3/uL (ref 0.7–4.0)
MCH: 26.8 pg (ref 26.0–34.0)
MCHC: 31.6 g/dL (ref 30.0–36.0)
MCV: 84.9 fL (ref 80.0–100.0)
Monocytes Absolute: 0.5 10*3/uL (ref 0.1–1.0)
Monocytes Relative: 9 %
Neutro Abs: 2.7 10*3/uL (ref 1.7–7.7)
Neutrophils Relative %: 54 %
Platelets: 252 10*3/uL (ref 150–400)
RBC: 4.85 MIL/uL (ref 4.22–5.81)
RDW: 12.7 % (ref 11.5–15.5)
WBC: 5 10*3/uL (ref 4.0–10.5)
nRBC: 0 % (ref 0.0–0.2)

## 2020-04-25 LAB — COMPREHENSIVE METABOLIC PANEL
ALT: 25 U/L (ref 0–44)
AST: 36 U/L (ref 15–41)
Albumin: 3.9 g/dL (ref 3.5–5.0)
Alkaline Phosphatase: 96 U/L (ref 38–126)
Anion gap: 7 (ref 5–15)
BUN: 15 mg/dL (ref 6–20)
CO2: 28 mmol/L (ref 22–32)
Calcium: 9.6 mg/dL (ref 8.9–10.3)
Chloride: 105 mmol/L (ref 98–111)
Creatinine, Ser: 1.13 mg/dL (ref 0.61–1.24)
GFR, Estimated: >60 mL/min (ref >60)
Glucose, Bld: 110 mg/dL — ABNORMAL HIGH (ref 70–99)
Potassium: 4.4 mmol/L (ref 3.5–5.1)
Sodium: 140 mmol/L (ref 135–145)
Total Bilirubin: 0.7 mg/dL (ref 0.3–1.2)
Total Protein: 7.6 g/dL (ref 6.5–8.1)

## 2020-04-25 LAB — URINALYSIS, ROUTINE W REFLEX MICROSCOPIC
Bilirubin Urine: NEGATIVE
Glucose, UA: NEGATIVE mg/dL
Hgb urine dipstick: NEGATIVE
Ketones, ur: NEGATIVE mg/dL
Leukocytes,Ua: NEGATIVE
Nitrite: NEGATIVE
Protein, ur: NEGATIVE mg/dL
Specific Gravity, Urine: 1.014 (ref 1.005–1.030)
pH: 5 (ref 5.0–8.0)

## 2020-04-25 LAB — TYPE AND SCREEN
ABO/RH(D): O POS
Antibody Screen: NEGATIVE

## 2020-04-25 LAB — SARS CORONAVIRUS 2 (TAT 6-24 HRS): SARS Coronavirus 2: NEGATIVE

## 2020-04-25 LAB — SURGICAL PCR SCREEN
MRSA, PCR: NEGATIVE
Staphylococcus aureus: POSITIVE — AB

## 2020-04-25 NOTE — Progress Notes (Signed)
  [facility] Perioperative Services: Pre-Admission/Anesthesia Testing  Date: 04/25/20  Name: Jeremy Cox MRN:   [id number]  Re: EKG changes and need for cardiac evaluation prior to surgery   Case: [id number] Date/Time: 04/27/20 1033   Procedure: TOTAL KNEE REVISION (Right Knee)   Anesthesia type: Choice   Pre-op diagnosis: Status post right partial knee replacement   Location: [facility] OR ROOM 03 / [facility] ORS FOR ANESTHESIA GROUP   Surgeons: Christena Flake, MD    Patient scheduled for the above procedure on 04/27/2020 with Dr. Leron Croak.  As part of his presurgical testing, patient presented to the clinic on 04/25/2020 for labs and EKG.  Of note, patient was preop had on 04/19/2020, however could not come in for his presurgical testing until today. He was informed by PAT nurse at the time of his preop call that any issues with his labs or EKG could potentially result in the need to postpone his procedure; patient verbalized understanding.  ECG performed today revealed sinus bradycardia at a rate of 55 bpm with acute T wave inversions noted in leads III and aVF.  Previous ECG on 02/18/2019 revealed NSR with SA at a rate of 67 bpm with evidence of nonspecific inferior T wave abnormality.  This has progressed and there is contiguous TWIs as previous mentioned; see tracing below.  Call placed to [facility] in efforts to determine feasibility of an expedited consult in order to avoid delaying patient's procedure as scheduled on 04/27/2020.  Cardiology practice unable to accommodate expedited appointment due to holiday scheduling.  Call placed to attending surgeon's office to make them aware; spoke with Elmarie Shiley, CMA (surgery scheduler).  Tiffany to touch base with cardiology department at [facility] to see if patient can be seen for clearance prior to his procedure.  If not, case will need to be postponed pending cardiology evaluation and likely ischemic evaluation. I have  spoken with the patient and explained the changes to him. He will follow up with Dr. Binnie Rail office and cardiology for further evaluation.     Quentin Mulling, MSN, APRN, FNP-C, CEN [facility]  Peri-operative Services Nurse Practitioner Phone: [phone] 04/25/20 10:09 AM

## 2020-04-25 NOTE — Pre-Procedure Instructions (Signed)
PT DID NOT SHOW UP FOR HIS ANESTHESIA PREOP-CALLED PT TO INFORM HIM HE MISSED HIS APPT-PT STATES HE THOUGHT THE ONLY PREOP HE HAD WAS IN [place] THAT DAY WITH HIS SURGEON-PHONE INTERVIEW WAS DONE TO COMPLETE ANESTHESIA PART OF INTERVIEW. I TOLD PT THAT HE NEEDED TO COME IN FOR LABS/EKG/MRSA/UA AND THAT IT IS NOT IDEAL TO BE DONE THE DAY OF COVID TEST DUE TO ANY ABNORMALITIES THAT COULD POTENTIALLY CANCEL HIS SURGERY. PT VERBALIZED UNDERSTANDING OF THIS AND STATES THAT HE CANNOT COME IN ANY SOONER THAN HIS COVID TEST

## 2020-04-27 ENCOUNTER — Inpatient Hospital Stay: Admission: RE | Admit: 2020-04-27 | Payer: PRIVATE HEALTH INSURANCE | Source: Home / Self Care | Admitting: Surgery

## 2020-04-27 ENCOUNTER — Encounter: Admission: RE | Payer: Self-pay | Source: Home / Self Care

## 2020-04-27 SURGERY — TOTAL KNEE REVISION
Anesthesia: Choice | Site: Knee | Laterality: Right

## 2020-06-02 ENCOUNTER — Other Ambulatory Visit: Payer: Self-pay | Admitting: Surgery

## 2020-06-02 ENCOUNTER — Other Ambulatory Visit: Admission: RE | Admit: 2020-06-02 | Payer: PRIVATE HEALTH INSURANCE | Source: Ambulatory Visit

## 2020-06-05 ENCOUNTER — Other Ambulatory Visit: Payer: Self-pay

## 2020-06-05 ENCOUNTER — Other Ambulatory Visit
Admission: RE | Admit: 2020-06-05 | Discharge: 2020-06-05 | Disposition: A | Discharge status: Home / Self Care | Payer: PRIVATE HEALTH INSURANCE | Source: Ambulatory Visit | Attending: Surgery | Admitting: Surgery

## 2020-06-05 DIAGNOSIS — Z01812 Encounter for preprocedural laboratory examination: Secondary | ICD-10-CM | POA: Insufficient documentation

## 2020-06-05 DIAGNOSIS — Z20822 Contact with and (suspected) exposure to covid-19: Secondary | ICD-10-CM | POA: Insufficient documentation

## 2020-06-05 LAB — SARS CORONAVIRUS 2 (TAT 6-24 HRS): SARS Coronavirus 2: NEGATIVE

## 2020-06-05 MED ORDER — CEFAZOLIN SODIUM-DEXTROSE 2-4 GM/100ML-% IV SOLN
2.0000 g | INTRAVENOUS | Status: AC
  Administered 2020-06-06: 2 g via INTRAVENOUS

## 2020-06-05 MED ORDER — FAMOTIDINE 20 MG PO TABS: 20.0000 mg | ORAL_TABLET | Freq: Once | ORAL | Status: AC

## 2020-06-05 NOTE — OR Nursing (Signed)
Called pt's cell phone to advice him of new time for procedure and could not get in touch with him. Called pt's daughter and spoke with her,(Kailyn Shuford), regarding pt's time to coming in the morning for procedure to be here at 06:00 am. She stated that she was going to drive him here in the morning and that she was going to advice him of the time change. Continue to monitor.

## 2020-06-06 ENCOUNTER — Ambulatory Visit: Payer: PRIVATE HEALTH INSURANCE

## 2020-06-06 ENCOUNTER — Ambulatory Visit
Admission: RE | Admit: 2020-06-06 | Discharge: 2020-06-06 | Disposition: A | Discharge status: Home / Self Care | Payer: PRIVATE HEALTH INSURANCE | Attending: Surgery | Admitting: Surgery

## 2020-06-06 ENCOUNTER — Encounter: Payer: Self-pay | Admitting: Surgery

## 2020-06-06 ENCOUNTER — Ambulatory Visit: Payer: PRIVATE HEALTH INSURANCE | Admitting: Urgent Care

## 2020-06-06 ENCOUNTER — Encounter
Admission: RE | Disposition: A | Discharge status: Home / Self Care | Payer: Self-pay | Source: Home / Self Care | Attending: Surgery

## 2020-06-06 DIAGNOSIS — Z79899 Other long term (current) drug therapy: Secondary | ICD-10-CM | POA: Diagnosis not present

## 2020-06-06 DIAGNOSIS — Y9389 Activity, other specified: Secondary | ICD-10-CM | POA: Diagnosis not present

## 2020-06-06 DIAGNOSIS — M65861 Other synovitis and tenosynovitis, right lower leg: Secondary | ICD-10-CM | POA: Insufficient documentation

## 2020-06-06 DIAGNOSIS — M659 Synovitis and tenosynovitis, unspecified: Secondary | ICD-10-CM | POA: Diagnosis present

## 2020-06-06 DIAGNOSIS — Z7901 Long term (current) use of anticoagulants: Secondary | ICD-10-CM | POA: Insufficient documentation

## 2020-06-06 DIAGNOSIS — Z833 Family history of diabetes mellitus: Secondary | ICD-10-CM | POA: Insufficient documentation

## 2020-06-06 DIAGNOSIS — Z820 Family history of epilepsy and other diseases of the nervous system: Secondary | ICD-10-CM | POA: Insufficient documentation

## 2020-06-06 DIAGNOSIS — T84032A Mechanical loosening of internal right knee prosthetic joint, initial encounter: Secondary | ICD-10-CM | POA: Insufficient documentation

## 2020-06-06 DIAGNOSIS — Z8249 Family history of ischemic heart disease and other diseases of the circulatory system: Secondary | ICD-10-CM | POA: Diagnosis not present

## 2020-06-06 DIAGNOSIS — X58XXXA Exposure to other specified factors, initial encounter: Secondary | ICD-10-CM | POA: Diagnosis not present

## 2020-06-06 DIAGNOSIS — Z800 Family history of malignant neoplasm of digestive organs: Secondary | ICD-10-CM | POA: Insufficient documentation

## 2020-06-06 DIAGNOSIS — Z96651 Presence of right artificial knee joint: Secondary | ICD-10-CM

## 2020-06-06 DIAGNOSIS — Z8349 Family history of other endocrine, nutritional and metabolic diseases: Secondary | ICD-10-CM | POA: Insufficient documentation

## 2020-06-06 HISTORY — PX: TOTAL KNEE REVISION: SHX996

## 2020-06-06 LAB — TYPE AND SCREEN
ABO/RH(D): O POS
Antibody Screen: NEGATIVE

## 2020-06-06 SURGERY — TOTAL KNEE REVISION
Anesthesia: Spinal | Site: Knee | Laterality: Right

## 2020-06-06 MED ORDER — KETOROLAC TROMETHAMINE 30 MG/ML IJ SOLN
INTRAMUSCULAR | Status: AC
  Administered 2020-06-06: 30 mg via INTRAVENOUS
  Filled 2020-06-06: qty 1

## 2020-06-06 MED ORDER — LACTATED RINGERS IV SOLN: INTRAVENOUS | Status: DC

## 2020-06-06 MED ORDER — BUPIVACAINE-EPINEPHRINE (PF) 0.5% -1:200000 IJ SOLN
INTRAMUSCULAR | Status: DC | PRN
  Administered 2020-06-06: 30 mL

## 2020-06-06 MED ORDER — ACETAMINOPHEN 10 MG/ML IV SOLN: 1000.0000 mg | Freq: Once | INTRAVENOUS | Status: AC

## 2020-06-06 MED ORDER — LIDOCAINE HCL (CARDIAC) PF 100 MG/5ML IV SOSY
PREFILLED_SYRINGE | INTRAVENOUS | Status: DC | PRN
  Administered 2020-06-06: 40 mg via INTRAVENOUS

## 2020-06-06 MED ORDER — TRANEXAMIC ACID 1000 MG/10ML IV SOLN
INTRAVENOUS | Status: DC | PRN
  Administered 2020-06-06: 1000 mg via TOPICAL

## 2020-06-06 MED ORDER — CEFAZOLIN SODIUM-DEXTROSE 2-4 GM/100ML-% IV SOLN
INTRAVENOUS | Status: AC
  Administered 2020-06-06: 2 g via INTRAVENOUS
  Filled 2020-06-06: qty 100

## 2020-06-06 MED ORDER — PROPOFOL 500 MG/50ML IV EMUL
INTRAVENOUS | Status: AC
  Filled 2020-06-06: qty 50

## 2020-06-06 MED ORDER — EPHEDRINE 5 MG/ML INJ
INTRAVENOUS | Status: AC
  Filled 2020-06-06: qty 10

## 2020-06-06 MED ORDER — ONDANSETRON HCL 4 MG PO TABS: 4.0000 mg | ORAL_TABLET | Freq: Four times a day (QID) | ORAL | Status: DC | PRN

## 2020-06-06 MED ORDER — SODIUM CHLORIDE 0.9 % BOLUS PEDS
250.0000 mL | Freq: Once | INTRAVENOUS | Status: AC
  Administered 2020-06-06: 250 mL via INTRAVENOUS

## 2020-06-06 MED ORDER — CEFAZOLIN SODIUM-DEXTROSE 2-4 GM/100ML-% IV SOLN
INTRAVENOUS | Status: AC
  Filled 2020-06-06: qty 100

## 2020-06-06 MED ORDER — KETOROLAC TROMETHAMINE 30 MG/ML IJ SOLN: 30.0000 mg | Freq: Once | INTRAMUSCULAR | Status: AC

## 2020-06-06 MED ORDER — ACETAMINOPHEN 10 MG/ML IV SOLN
INTRAVENOUS | Status: AC
  Administered 2020-06-06: 1000 mg
  Filled 2020-06-06: qty 100

## 2020-06-06 MED ORDER — OXYCODONE HCL 5 MG PO TABS: 5.0000 mg | ORAL_TABLET | ORAL | 0 refills | Status: AC | PRN

## 2020-06-06 MED ORDER — ORAL CARE MOUTH RINSE: 15.0000 mL | Freq: Once | OROMUCOSAL | Status: DC

## 2020-06-06 MED ORDER — ONDANSETRON HCL 4 MG/2ML IJ SOLN: 4.0000 mg | Freq: Four times a day (QID) | INTRAMUSCULAR | Status: DC | PRN

## 2020-06-06 MED ORDER — KETOROLAC TROMETHAMINE 15 MG/ML IJ SOLN: 15.0000 mg | Freq: Four times a day (QID) | INTRAMUSCULAR | Status: DC

## 2020-06-06 MED ORDER — ONDANSETRON HCL 4 MG/2ML IJ SOLN: 4.0000 mg | Freq: Once | INTRAMUSCULAR | Status: DC | PRN

## 2020-06-06 MED ORDER — PROPOFOL 10 MG/ML IV BOLUS
INTRAVENOUS | Status: AC
  Filled 2020-06-06: qty 20

## 2020-06-06 MED ORDER — PROPOFOL 500 MG/50ML IV EMUL
INTRAVENOUS | Status: DC | PRN
  Administered 2020-06-06: 40 mg via INTRAVENOUS
  Administered 2020-06-06: 75 ug/kg/min via INTRAVENOUS

## 2020-06-06 MED ORDER — SODIUM CHLORIDE 0.9 % IV SOLN: INTRAVENOUS | Status: DC

## 2020-06-06 MED ORDER — MIDAZOLAM HCL 2 MG/2ML IJ SOLN
INTRAMUSCULAR | Status: AC
  Filled 2020-06-06: qty 2

## 2020-06-06 MED ORDER — FAMOTIDINE 20 MG PO TABS
ORAL_TABLET | ORAL | Status: AC
  Administered 2020-06-06: 20 mg via ORAL
  Filled 2020-06-06: qty 1

## 2020-06-06 MED ORDER — TRANEXAMIC ACID 1000 MG/10ML IV SOLN
INTRAVENOUS | Status: AC
  Filled 2020-06-06: qty 10

## 2020-06-06 MED ORDER — MIDAZOLAM HCL 2 MG/2ML IJ SOLN
INTRAMUSCULAR | Status: DC | PRN
  Administered 2020-06-06: 2 mg via INTRAVENOUS

## 2020-06-06 MED ORDER — FENTANYL CITRATE (PF) 100 MCG/2ML IJ SOLN: 25.0000 ug | INTRAMUSCULAR | Status: DC | PRN

## 2020-06-06 MED ORDER — METOCLOPRAMIDE HCL 5 MG/ML IJ SOLN: 5.0000 mg | Freq: Three times a day (TID) | INTRAMUSCULAR | Status: DC | PRN

## 2020-06-06 MED ORDER — METOCLOPRAMIDE HCL 10 MG PO TABS: 5.0000 mg | ORAL_TABLET | Freq: Three times a day (TID) | ORAL | Status: DC | PRN

## 2020-06-06 MED ORDER — SODIUM CHLORIDE 0.9 % IV SOLN
INTRAVENOUS | Status: DC | PRN
  Administered 2020-06-06: 60 mL

## 2020-06-06 MED ORDER — BUPIVACAINE HCL (PF) 0.5 % IJ SOLN
INTRAMUSCULAR | Status: AC
  Filled 2020-06-06: qty 30

## 2020-06-06 MED ORDER — CHLORHEXIDINE GLUCONATE 0.12 % MT SOLN: 15.0000 mL | Freq: Once | OROMUCOSAL | Status: DC

## 2020-06-06 MED ORDER — FENTANYL CITRATE (PF) 100 MCG/2ML IJ SOLN
INTRAMUSCULAR | Status: AC
  Filled 2020-06-06: qty 2

## 2020-06-06 MED ORDER — CEFAZOLIN SODIUM-DEXTROSE 2-4 GM/100ML-% IV SOLN: 2.0000 g | Freq: Four times a day (QID) | INTRAVENOUS | Status: DC

## 2020-06-06 MED ORDER — BUPIVACAINE HCL (PF) 0.5 % IJ SOLN
INTRAMUSCULAR | Status: DC | PRN
  Administered 2020-06-06: 3 mL via INTRATHECAL

## 2020-06-06 MED ORDER — OXYCODONE HCL 5 MG PO TABS: 5.0000 mg | ORAL_TABLET | ORAL | Status: DC | PRN

## 2020-06-06 MED ORDER — ACETAMINOPHEN 500 MG PO TABS: 1000.0000 mg | ORAL_TABLET | Freq: Four times a day (QID) | ORAL | Status: DC

## 2020-06-06 MED ORDER — EPHEDRINE SULFATE 50 MG/ML IJ SOLN
INTRAMUSCULAR | Status: DC | PRN
  Administered 2020-06-06: 10 mg via INTRAVENOUS

## 2020-06-06 MED ORDER — BUPIVACAINE LIPOSOME 1.3 % IJ SUSP
INTRAMUSCULAR | Status: AC
  Filled 2020-06-06: qty 20

## 2020-06-06 MED ORDER — APIXABAN 2.5 MG PO TABS: 2.5000 mg | ORAL_TABLET | Freq: Two times a day (BID) | ORAL | 0 refills | Status: AC

## 2020-06-06 MED ORDER — EPINEPHRINE PF 1 MG/ML IJ SOLN
INTRAMUSCULAR | Status: AC
  Filled 2020-06-06: qty 1

## 2020-06-06 MED ORDER — CHLORHEXIDINE GLUCONATE 0.12 % MT SOLN
OROMUCOSAL | Status: AC
  Administered 2020-06-06: 15 mL
  Filled 2020-06-06: qty 15

## 2020-06-06 SURGICAL SUPPLY — 61 items
APL PRP STRL LF DISP 70% ISPRP (MISCELLANEOUS) ×1
BEARING TIBIAL INSERT 75X16 (Insert) ×2 IMPLANT
BLADE SAW SAG 25X90X1.19 (BLADE) ×2 IMPLANT
BNDG COHESIVE 6X5 TAN STRL LF (GAUZE/BANDAGES/DRESSINGS) ×2 IMPLANT
BNDG ELASTIC 6X5.8 VLCR STR LF (GAUZE/BANDAGES/DRESSINGS) ×2 IMPLANT
BRNG TIB 75X16 ANT STAB MDLR (Insert) ×1 IMPLANT
CANISTER SUCT 1200ML W/VALVE (MISCELLANEOUS) ×2 IMPLANT
CANISTER SUCT 3000ML PPV (MISCELLANEOUS) ×4 IMPLANT
CEMENT BONE R 1X40 (Cement) ×4 IMPLANT
CEMENT VACUUM MIXING SYSTEM (MISCELLANEOUS) ×4 IMPLANT
CHLORAPREP W/TINT 26 (MISCELLANEOUS) ×2 IMPLANT
COOLER POLAR GLACIER W/PUMP (MISCELLANEOUS) ×2 IMPLANT
COVER WAND RF STERILE (DRAPES) ×2 IMPLANT
CUFF TOURN SGL QUICK 24 (TOURNIQUET CUFF)
CUFF TOURN SGL QUICK 30 (TOURNIQUET CUFF) ×2
CUFF TRNQT CYL 24X4X16.5-23 (TOURNIQUET CUFF) IMPLANT
CUFF TRNQT CYL 30X4X21-28X (TOURNIQUET CUFF) ×1 IMPLANT
DRAPE IMP U-DRAPE 54X76 (DRAPES) ×2 IMPLANT
DRAPE SURG 17X11 SM STRL (DRAPES) ×4 IMPLANT
DRSG MEPILEX SACRM 8.7X9.8 (GAUZE/BANDAGES/DRESSINGS) ×2 IMPLANT
DRSG OPSITE POSTOP 4X14 (GAUZE/BANDAGES/DRESSINGS) ×2 IMPLANT
ELECT CAUTERY BLADE 6.4 (BLADE) IMPLANT
ELECT REM PT RETURN 9FT ADLT (ELECTROSURGICAL) ×2
ELECTRODE REM PT RTRN 9FT ADLT (ELECTROSURGICAL) ×1 IMPLANT
FEMORAL COMPONENT 70MM RIGHT (Joint) ×2 IMPLANT
GLOVE BIO SURGEON STRL SZ7.5 (GLOVE) ×8 IMPLANT
GLOVE BIO SURGEON STRL SZ8 (GLOVE) ×8 IMPLANT
GLOVE INDICATOR 8.0 STRL GRN (GLOVE) ×2 IMPLANT
GLOVE SRG 8 PF TXTR STRL LF DI (GLOVE) ×1 IMPLANT
GLOVE SURG UNDER POLY LF SZ8 (GLOVE) ×2
GOWN STRL REUS W/ TWL LRG LVL3 (GOWN DISPOSABLE) ×1 IMPLANT
GOWN STRL REUS W/ TWL XL LVL3 (GOWN DISPOSABLE) ×1 IMPLANT
GOWN STRL REUS W/TWL LRG LVL3 (GOWN DISPOSABLE) ×2
GOWN STRL REUS W/TWL XL LVL3 (GOWN DISPOSABLE) ×2
HOOD PEEL AWAY FLYTE STAYCOOL (MISCELLANEOUS) ×6 IMPLANT
IMMBOLIZER KNEE 19 BLUE UNIV (SOFTGOODS) IMPLANT
KIT TURNOVER KIT A (KITS) ×2 IMPLANT
MANIFOLD NEPTUNE II (INSTRUMENTS) ×2 IMPLANT
NEEDLE SPNL 20GX3.5 QUINCKE YW (NEEDLE) ×2 IMPLANT
NS IRRIG 500ML POUR BTL (IV SOLUTION) ×2 IMPLANT
PACK TOTAL KNEE (MISCELLANEOUS) ×2 IMPLANT
PAD WRAPON POLAR KNEE (MISCELLANEOUS) ×1 IMPLANT
PEG PATELLA SERIES A 37MMX10MM (Orthopedic Implant) ×2 IMPLANT
PENCIL SMOKE EVACUATOR (MISCELLANEOUS) ×2 IMPLANT
PLATE KNEE TIBIAL 75MM FIXED (Plate) ×2 IMPLANT
PULSAVAC PLUS IRRIG FAN TIP (DISPOSABLE) ×2
SOL .9 NS 3000ML IRR  AL (IV SOLUTION) ×1
SOL .9 NS 3000ML IRR AL (IV SOLUTION) ×1
SOL .9 NS 3000ML IRR UROMATIC (IV SOLUTION) ×1 IMPLANT
STAPLER SKIN PROX 35W (STAPLE) ×2 IMPLANT
SUCTION FRAZIER HANDLE 10FR (MISCELLANEOUS) ×1
SUCTION TUBE FRAZIER 10FR DISP (MISCELLANEOUS) ×1 IMPLANT
SUT VIC AB 0 CT1 36 (SUTURE) ×8 IMPLANT
SUT VIC AB 2-0 CT1 27 (SUTURE) ×8
SUT VIC AB 2-0 CT1 TAPERPNT 27 (SUTURE) ×4 IMPLANT
SYR 10ML LL (SYRINGE) ×2 IMPLANT
SYR 20ML LL LF (SYRINGE) ×2 IMPLANT
SYR 30ML LL (SYRINGE) ×4 IMPLANT
TIP FAN IRRIG PULSAVAC PLUS (DISPOSABLE) ×1 IMPLANT
TRAY FOLEY SLVR 16FR LF STAT (SET/KITS/TRAYS/PACK) IMPLANT
WRAPON POLAR PAD KNEE (MISCELLANEOUS) ×2

## 2020-06-06 NOTE — Anesthesia Procedure Notes (Signed)
Spinal  Patient location during procedure: OR Staffing Performed: anesthesiologist  Preanesthetic Checklist Completed: patient identified, IV checked, site marked, risks and benefits discussed, surgical consent, monitors and equipment checked, pre-op evaluation and timeout performed Spinal Block Patient position: sitting Prep: Betadine Patient monitoring: heart rate, continuous pulse ox, blood pressure and cardiac monitor Approach: midline Location: L4-5 Injection technique: single-shot Needle Needle type: Whitacre and Introducer  Needle gauge: 24 G Needle length: 9 cm Needle insertion depth: 6 cm Additional Notes Negative paresthesia. Negative blood return. Positive free-flowing CSF. Expiration date of kit checked and confirmed. Patient tolerated procedure well, without complications.       

## 2020-06-06 NOTE — Op Note (Signed)
06/06/2020  10:39 AM  Patient:   Jeremy Cox  Pre-Op Diagnosis:   Aseptic loosening of UKA, right knee.  Post-Op Diagnosis:   Same  Procedure:   Revision right TKA using all-cemented Biomet Vanguard system with a 70 mm PCR femur, a 75 mm tibial tray with a 16 mm anterior stabilized E-poly insert, and a 34 x 8.5 mm all-poly 3-pegged domed patella.  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 Over the past 6 months, he has noted progressive worsening pain in the medial greater than lateral aspect of the right knee. X-rays and subsequent CT scanning were suspicious for aseptic loosening of the tibial and possibly the femoral components. The patient's symptoms have progressed despite medications, activity modification, injections, etc. The patient presents at this time for a revision right total knee arthroplasty.  Procedure:   The patient was brought into the operating room. After adequate spinal anesthesia was obtained, the patient was lain in the supine position. The patient appeared to have a vasovagal response to the sedation medication he was given, so the anesthesiologist was called back into the room. The patient was observed for about 10 minutes and he appeared to come out of it well and without any sequela.  Therefore, anesthesia provided the greenlight to continue with the surgery. The right lower extremity was prepped with ChloraPrep solution and draped sterilely. Preoperative antibiotics were administered. After verifying the  proper laterality with a surgical timeout, the limb was exsanguinated with an Esmarch and the tourniquet inflated to 300 mmHg.   Utilizing the previous incision, a standard anterior approach to the knee was made through an approximately 7 inch incision. The incision was carried down through the subcutaneous tissues to expose superficial retinaculum. This was split the length of the incision and the medial flap elevated sufficiently to expose the medial retinaculum. The medial retinaculum was incised, leaving a 3-4 mm cuff of tissue on the patella. This was extended distally along the medial border of the patellar tendon and proximally through the medial third of the quadriceps tendon. The soft tissues elevated off the anteromedial and anterolateral aspects of the proximal tibia to the level of the collateral ligaments. The lateral meniscus was removed, as was the anterior cruciate ligament. The meniscal bearing device was removed using a Kocher. Next, first the tibial component and then the femoral component were removed using 1/4 inch osteotome. The tibial component was obviously loose as it lifted easily with most of the cement attached to the component.  The femoral component also was removed with relative ease as the cement again remained adherent to the component. Of note, a culture of the initial joint fluid was obtained. In addition, several soft tissue samples from around the components were obtained and sent to pathology to assess for signs of acute inflammation. No neutrophils were identified in either sample sent, according to the pathologist.  The tibial guide was positioned and the appropriate proximal tibial cut made using an oscillating saw.  This cut was redone 2 mm lower in order to fully freshen the posteromedial portion of the tibia.  The surface was measured and found to be optimally plicated by a 70 mm tibial component.  Attention was directed to the distal femur. The intramedullary canal  was accessed through a 3/8" drill hole. The intramedullary guide was inserted and positioned in order to obtain a neutral flexion gap. The intercondylar block was positioned with care taken to avoid notching the anterior cortex of the femur. The appropriate cut was made. Next, the distal cutting block was placed at 5 of valgus alignment. Using the 9 mm slot, the distal cut was made. The distal femur was measured and found to be optimally replicated by the 70 mm component. The 70 mm 4-in-1 cutting block was positioned and first the posterior, then the posterior chamfer, the anterior chamfer, and finally the anterior cuts were made. At this point, the posterior portions medial and lateral menisci were removed. A trial reduction was performed using the appropriate femoral and tibial components with the 16 mm insert. This demonstrated excellent stability to varus and valgus stressing both in flexion and extension while permitting full extension. Patella tracking was assessed and found to be excellent. Therefore, the tibial guide position was marked on the proximal tibia. The patella thickness was measured and found to be 24 mm. Therefore, the appropriate cut was made. The patellar surface was measured and found to be optimally replicated by the 34 mm component. The three peg holes were drilled in place before the trial button was inserted. Patella tracking was assessed and found to be excellent, passing the "no thumb test". The lug holes were drilled into the distal femur before the trial component was removed, leaving only the tibial tray. The keel was then created using the appropriate tower, reamer, and punch.  The bony surfaces were prepared for cementing by irrigating them thoroughly with sterile saline solution via the jet lavage system. A bone plug was fashioned from some of the bone that had been removed previously and used to plug the distal femoral canal. In addition, 20 cc of Exparel diluted out to 60 cc  with normal saline and 30 cc of 0.5% Sensorcaine were injected into the postero-medial and postero-lateral aspects of the knee, the medial and lateral gutter regions, and the peri-incisional tissues to help with postoperative analgesia. Meanwhile, the cement was being mixed on the back table. When it was ready, the tibial tray was cemented in first. The excess cement was removed using Personal assistant. Next, the femoral component was impacted into place. Again, the excess cement was removed using Personal assistant. The 16 mm trial insert was positioned and the knee brought into extension while the cement hardened. Finally, the patella was cemented into place and secured using the patellar clamp. Again, the excess cement was removed using Personal assistant. Once the cement had hardened, the knee was placed through a range of motion with the findings as described above. Therefore, the trial insert was removed and, after verifying that no cement had been retained posteriorly, the permanent 16 mm anterior stabilized E-polyethylene insert was positioned and secured using the appropriate key locking mechanism. Again the knee was placed through a range of motion with the findings as described above.  The wound was copiously irrigated with sterile saline solution using the jet lavage system before the quadriceps tendon and retinacular layer  were reapproximated using #0 Vicryl interrupted sutures. The superficial retinacular layer also was closed using a running #0 Vicryl suture. A total of 10 cc of transexemic acid (TXA) was injected intra-articularly before the subcutaneous tissues were closed in several layers using 2-0 Vicryl interrupted sutures. The skin was closed using staples. A sterile honeycomb dressing was applied to the skin before the leg was wrapped with an Ace wrap to accommodate the Polar Care device. The patient was then awakened and returned to the recovery room in satisfactory condition after tolerating the  procedure well.

## 2020-06-06 NOTE — Anesthesia Postprocedure Evaluation (Signed)
Anesthesia Post Note  Patient: Jeremy Cox  Procedure(s) Performed: TOTAL KNEE REVISION (Right Knee)  Patient location during evaluation: PACU Anesthesia Type: Spinal Level of consciousness: awake Pain management: pain level controlled Vital Signs Assessment: post-procedure vital signs reviewed and stable Respiratory status: spontaneous breathing Cardiovascular status: stable Postop Assessment: no headache, no apparent nausea or vomiting and spinal receding Anesthetic complications: no   No complications documented.   Last Vitals:  Vitals:   06/06/20 1330 06/06/20 1407  BP: (!) 143/91 (!) 153/87  Pulse: 62 (!) 52  Resp: 16 15  Temp:  36.5 C  SpO2: 100% 100%    Last Pain:  Vitals:   06/06/20 1407  TempSrc: Oral  PainSc: 2                  [name]

## 2020-06-06 NOTE — Transfer of Care (Signed)
Immediate Anesthesia Transfer of Care Note  Patient: Jeremy Cox  Procedure(s) Performed: TOTAL KNEE REVISION (Right Knee)  Patient Location: PACU  Anesthesia Type:MAC and Spinal  Level of Consciousness: awake, alert  and oriented  Airway & Oxygen Therapy: Patient Spontanous Breathing  Post-op Assessment: Report given to RN and Post -op Vital signs reviewed and stable  Post vital signs: Reviewed and stable  Last Vitals:  Vitals Value Taken Time  BP 143/87 06/06/20 1025  Temp    Pulse 50 06/06/20 1030  Resp 14 06/06/20 1030  SpO2 100 % 06/06/20 1030  Vitals shown include unvalidated device data.  Last Pain:  Vitals:   06/06/20 0622  TempSrc: Temporal  PainSc: 1          Complications: No complications documented.

## 2020-06-06 NOTE — Anesthesia Preprocedure Evaluation (Signed)
Anesthesia Evaluation  Patient identified by MRN, date of birth, ID band Patient awake    Reviewed: Allergy & Precautions, NPO status , Patient's Chart, lab work & pertinent test results  History of Anesthesia Complications Negative for: history of anesthetic complications  Airway Mallampati: II       Dental   Pulmonary neg sleep apnea, neg COPD, Not current smoker,           Cardiovascular hypertension, Pt. on medications (-) Past MI and (-) CHF (-) dysrhythmias (-) Valvular Problems/Murmurs     Neuro/Psych neg Seizures    GI/Hepatic Neg liver ROS, neg GERD  ,  Endo/Other  neg diabetes  Renal/GU negative Renal ROS     Musculoskeletal   Abdominal   Peds  Hematology   Anesthesia Other Findings   Reproductive/Obstetrics                            Anesthesia Physical Anesthesia Plan  ASA: II  Anesthesia Plan: Spinal   Post-op Pain Management:    Induction:   PONV Risk Score and Plan:   Airway Management Planned:   Additional Equipment:   Intra-op Plan:   Post-operative Plan:   Informed Consent: I have reviewed the patients History and Physical, chart, labs and discussed the procedure including the risks, benefits and alternatives for the proposed anesthesia with the patient or authorized representative who has indicated his/her understanding and acceptance.       Plan Discussed with:   Anesthesia Plan Comments:         Anesthesia Quick Evaluation  

## 2020-06-06 NOTE — Evaluation (Signed)
Physical Therapy Evaluation Patient Details Name: Jeremy Cox MRN: [id number] DOB: [birth year] Today's Date: 06/06/2020   History of Present Illness  55 y/o male who had a R partial knee replacement ~15 months ago, now here for a revision to total knee replacement.  Clinical Impression  Pt eager to work with PT and recognized me from his partial knee replacement 2 years ago.  He did well with mobility and strength tasks, though he was still having foot drop on operative leg.  Pt lacks a few degrees from TKE, stressed knee flat resting posture and importance of not "over-doing-it" and getting too /sore stiff when he first goes home.  Pt able to safely walk 100 ft and up/down 8 steps with single rail.  Has crutches at home, but will need FWW before he leaves.      Follow Up Recommendations Home health PT;Follow surgeon's recommendation for DC plan and follow-up therapies    Equipment Recommendations  Rolling walker with 5" wheels    Recommendations for Other Services       Precautions / Restrictions Precautions Precautions: Fall;Knee Precaution Booklet Issued: Yes (comment) Restrictions Weight Bearing Restrictions: Yes RLE Weight Bearing: Weight bearing as tolerated      Mobility  Bed Mobility Overal bed mobility: Independent                  Transfers Overall transfer level: Modified independent Equipment used: Rolling walker (2 wheeled)             General transfer comment: Pt able to rise with minimal use of UEs, maintains static balance w/o UEs but with majority L LE WBing.  Ambulation/Gait Ambulation/Gait assistance: Min guard Gait Distance (Feet): 100 Feet Assistive device: Rolling walker (2 wheeled)       General Gait Details: Pt was able to perform prolonged bout of ambulation but did lack TKE with guarded/stiff posturing.  Continued lack of DF control post-op was an additional issue causing a lack of quality of motion/cadence with increased UE/AD  reliance during L stance.  Stairs Stairs: Yes Stairs assistance: Min guard Stair Management: One rail Left Number of Stairs: 8 General stair comments: Pt able to easily negotiate steps, mostly reliant on good UE strength/rail, R foot drop did make stair negotiation more difficult than it otherwise should/would have been.  No LOBs or safety issues.  Wheelchair Mobility    Modified Rankin (Stroke Patients Only)       Balance Overall balance assessment: Modified Independent                                           Pertinent Vitals/Pain Pain Assessment: 0-10 Pain Score: 7  Pain Location: R anterior knee (minimal pain at rest, increases significantly with activity)    Home Living Family/patient expects to be discharged to:: Private residence Living Arrangements: Alone Available Help at Discharge: Family;Available 24 hours/day (daughter is staying with him 24/7 initially) Type of Home: House Home Access: Level entry     Home Layout: Two level Home Equipment: Crutches      Prior Function Level of Independence: Independent         Comments: Pt lives alone, manages all needs, works (desk job)     Higher education careers adviser        Extremity/Trunk Assessment   Upper Extremity Assessment Upper Extremity Assessment: Overall WFL for tasks assessed  Lower Extremity Assessment Lower Extremity Assessment: Overall WFL for tasks assessed (expected R post-op weakness, no ankle DF (MD aware, likely from block))       Communication   Communication: No difficulties  Cognition Arousal/Alertness: Awake/alert Behavior During Therapy: WFL for tasks assessed/performed Overall Cognitive Status: Within Functional Limits for tasks assessed                                        General Comments      Exercises Total Joint Exercises Ankle Circles/Pumps: PROM;10 reps (lacks AROM DF post-op, likely nerve block) Short Arc Quad: AROM;10 reps Heel  Slides: AROM;10 reps (lightly resisted leg extensions) Hip ABduction/ADduction: Strengthening;10 reps Straight Leg Raises: AROM;10 reps Knee Flexion: PROM;5 reps Goniometric ROM: 2-90   Assessment/Plan    PT Assessment Patient needs continued PT services  PT Problem List Decreased strength;Decreased range of motion;Decreased activity tolerance;Decreased balance;Decreased mobility;Decreased knowledge of use of DME;Decreased safety awareness;Pain       PT Treatment Interventions DME instruction;Gait training;Stair training;Functional mobility training;Therapeutic activities;Therapeutic exercise;Balance training;Neuromuscular re-education;Patient/family education    PT Goals (Current goals can be found in the Care Plan section)  Acute Rehab PT Goals Patient Stated Goal: go home PT Goal Formulation: With patient Time For Goal Achievement: 06/20/20 Potential to Achieve Goals: Good    Frequency BID   Barriers to discharge        Co-evaluation               AM-PAC PT "6 Clicks" Mobility  Outcome Measure Help needed turning from your back to your side while in a flat bed without using bedrails?: None Help needed moving from lying on your back to sitting on the side of a flat bed without using bedrails?: None Help needed moving to and from a bed to a chair (including a wheelchair)?: None Help needed standing up from a chair using your arms (e.g., wheelchair or bedside chair)?: None Help needed to walk in hospital room?: A Little Help needed climbing 3-5 steps with a railing? : A Little 6 Click Score: 22    End of Session Equipment Utilized During Treatment: Gait belt Activity Tolerance: Patient tolerated treatment well;Patient limited by pain Patient left: in chair;with family/visitor present;with nursing/sitter in room Nurse Communication: Mobility status (need for walker) PT Visit Diagnosis: Muscle weakness (generalized) (M62.81);Pain;Difficulty in walking, not elsewhere  classified (R26.2) Pain - Right/Left: Right Pain - part of body: Knee    Time: 9798-9211 PT Time Calculation (min) (ACUTE ONLY): 53 min   Charges:   PT Evaluation $PT Eval Low Complexity: 1 Low PT Treatments $Gait Training: 8-22 mins $Therapeutic Exercise: 8-22 mins        Malachi Pro, DPT 06/06/2020, 3:31 PM

## 2020-06-06 NOTE — Progress Notes (Signed)
EKG was done at 1031.  Dr Deeann Cree at bedside 1041 to review EKG. Pt waiting on cardiology consult r/t brady rhythms in OR.  NAD at this time.

## 2020-06-06 NOTE — H&P (Addendum)
History of Present Illness: Jeremy Cox is a 55 y.o. who presents He is to undergo a revision of a right partial knee replacement to a total knee arthroplasty. Date of surgery was 02/25/2019. He is scheduled for revision on 06/06/2020. Patient was last seen in clinic on 02/09/2020 then again on 04/18/2020 for a history and physical. There is been no change in his condition since that time. Patient has backed off on his exercising. The patient continues to note intermittent discomfort in the knee which he rates at 5/10. The pain is localized to the medial aspect of the knee and is aggravated by more sustained activities. He has been exercising on a regular basis, including using a stationary bicycle, anelliptical machine, and playing golf. He has assiduously avoided any running or jumping activities, and denies any reinjury to the knee. He also denies any fevers or chills, and denies any significant swelling to the knee. Patient was seen by Dedra Skeens, PA-C on 02/08/2020 where x-rays of the knee were obtained and concern was raised for some subtle shifting of the tibial tray, prompting him to be referred to me for further evaluation and treatment. The patient is not taking any medications for pain. He has been performing full duties at work without restrictions and is tolerating this well.  Past Medical History: . Acute right medial meniscal tear 2018  . 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  . Hyperlipidemia 07/2018 (LDL 145)  . Hypertension  . Obesity (BMI 30.0-34.9)  . RAD (reactive airway disease) w/ URI's   Past Surgical History: . Right unicondylar knee arthroplasty. Right 02/25/2019 (Dr.Kienan Doublin)  . COLONOSCOPY 07/29/2019  Entire examined colon is normal/Repeat 27yrs/TKT  . VASECTOMY   Past Family History: . Thyroid disease Mother  . High blood pressure (Hypertension) Mother  . Alzheimer's disease Mother 31  . High blood pressure (Hypertension) Father  . Benign prostatic hyperplasia Father   . Other Father  s/p whipple for pancreatic mass. Portion of stomach & sm bowel removed for tumor as well.  . Pancreatic cancer Father  s/p chemo & XRT  . Diabetes Father  2/2 pancreatic cancer & treatment.  . Thyroid disease Sister  . Diabetes type II Maternal Uncle  . Diabetes type II Paternal Uncle  . Diabetes type II Maternal Aunt   Medications: . amLODIPine (NORVASC) 5 MG tablet Take 1 tablet (5 mg total) by mouth once daily 90 tablet 4  . ascorbic acid (VITAMIN C) 1000 MG tablet Take 1,000 mg by mouth once daily.  . colchicine (COLCRYS) 0.6 mg tablet Take 1 tablet (0.6 mg total) by mouth 2 (two) times daily 10 tablet 0  . multivitamin tablet Take 1 tablet by mouth once daily.  . sildenafiL (VIAGRA) 100 MG tablet TAKE 1 TABLET BY MOUTH ONCE DAILY AS NEEDED FOR ERECTILE DYSFUNCTION 12 tablet 1   Allergies: No Known Allergies   Review of Systems: A comprehensive 14 point ROS was performed, reviewed, and the pertinent orthopaedic findings are documented in the HPI.  Physical Exam: BP 124/86  Ht 181.6 cm (5' 11.5")  Wt (!) 101.2 kg (223 lb 3.2 oz)  BMI 30.70 kg/m   General: Well-developed well-nourished male seen in no acute distress.   HEENT: Atraumatic,normocephalic. Pupils are equal and reactive to light. Oropharynx is clear with moist mucosa  Lungs: Clear to auscultation bilaterally   Cardiovascular: Regular rate and rhythm. Normal S1, S2. No murmurs. No appreciable gallops or rubs. Peripheral pulses are palpable.  Abdomen: Soft, non-tender, nondistended.  Bowel sounds present  Right knee exam: The patientdemonstrates a minimal limp, favoring his right leg, butis not using any assistive devices. On inspection,his surgical incision iswell-healed and without evidence for infection. No swelling,erythema, ecchymosis, abrasions, or other skin abnormalities are identified, and he does not exhibit any evidence for a knee effusion.There is mildtenderness to  palpation along themedialaspectof the kneeand proximal tibia. He is able to extend his knee to 0 degrees and flex to 135degrees actively. Passively, he can be flexed to 140 degrees. His patella tracks well and is without crepitance. His knee is stable to varusandvalgus stressing. Heremainsneurovascularly intact to the right lower extremity and foot.  Neurological: The patient is alert and oriented Sensation to light touch appears to be intact and within normal limits Gross motor strength appeared to be equal to 5/5  Vascular : Peripheral pulses felt to be palpable. Capillary refill appears to be intact and within normal limits  X-ray: X-rays taken in [place] clinic suggests what may be slight varus tilting of the tibial component. There is no obvious surrounding osteolysis to suggest loosening of the component. No lytic lesions or fractures were noted.  Impression: 1. Status post right partial knee replacement with evidence of loosening implant  Plan: The treatment options, including both surgical and nonsurgical choices, have been discussed in detail with the patient. The patient like to proceed with a conversion of the partial knee replacement to a total knee replacement. The risks (including bleeding, infection, nerve and/or blood vessel injury, persistent or recurrent pain, loosening or failure of the components, stiffness, dislocation, need for further surgery, blood clots, strokes, heart attacks or arrhythmias, pneumonia, etc.) and benefits of the surgical procedure were discussed. The patient states his understanding and agrees to proceed. A formal written consent will be obtained by the nursing staff.   H&P reviewed and patient re-examined. No changes.

## 2020-06-06 NOTE — Consult Note (Signed)
[facility] Cardiology Consultation Note  Patient ID: Jeremy Cox, MRN: [id number], DOB/AGE: 01-23-1966 55 y.o. Admit date: 06/06/2020   Date of Consult: 06/06/2020 Primary Physician: Myrene Buddy, NP Primary Cardiologist: Lady Gary  Chief Complaint: No chief complaint on file.  Reason for Consult: Heart block  HPI: 55 y.o. male with no evidence of previous cardiovascular history who has had known hypertension and hyperlipidemia of which his hypertension is well controlled.  There is no family history of cardiovascular disease and prior to his surgical intervention of his knee the patient has done very well.  He has not had any PND orthopnea syncope dizziness nausea chest pain shortness of breath or diaphoresis.  There has been no evidence of significant symptoms with any activities.  EKG prior to his showed normal sinus rhythm otherwise normal EKG.  During his anesthesia the patient had period of time when awakening normal sinus rhythm but no ventricular depolarization and no ventricular escape for long pause.  This was most consistent with a significant vagal tone and other possible anesthesia at the time.  The patient has not had any prior rhythm disturbances or post rhythm disturbances since that period of time and has been watched thoroughly for the last several hours.  Therefore with no significant history of concerns would be acceptable for observation with rehabilitation  Past Medical History:  Diagnosis Date  . GERD (gastroesophageal reflux disease)    occ  . HSV (herpes simplex virus) infection 2018  . Hypertension   . Reactive airway disease       Surgical History:  Past Surgical History:  Procedure Laterality Date  . COLONOSCOPY WITH PROPOFOL N/A 07/29/2019   Procedure: COLONOSCOPY WITH PROPOFOL;  Surgeon: Toledo, Boykin Nearing, MD;  Location: [facility] ENDOSCOPY;  Service: Gastroenterology;  Laterality: N/A;  . PARTIAL KNEE ARTHROPLASTY Right 02/25/2019   Procedure:  UNICOMPARTMENTAL KNEE;  Surgeon: Christena Flake, MD;  Location: [facility] ORS;  Service: Orthopedics;  Laterality: Right;  Marland Kitchen VASECTOMY       Home Meds: Prior to Admission medications   Medication Sig Start Date End Date Taking? Authorizing Provider  amLODipine (NORVASC) 5 MG tablet Take 5 mg by mouth every morning. 11/09/18  Yes [provider]  apixaban (ELIQUIS) 2.5 MG TABS tablet Take 1 tablet (2.5 mg total) by mouth 2 (two) times daily. 06/07/20  Yes Poggi, Excell Seltzer, MD  Ascorbic Acid (VITAMIN C) 100 MG tablet Take 100 mg by mouth daily.   Yes [provider]  Multiple Vitamin (MULTIVITAMIN WITH MINERALS) TABS tablet Take 1 tablet by mouth daily.   Yes [provider]  oxyCODONE (ROXICODONE) 5 MG immediate release tablet Take 1-2 tablets (5-10 mg total) by mouth every 4 (four) hours as needed for moderate pain or severe pain. 06/06/20  Yes Poggi, Excell Seltzer, MD  sildenafil (VIAGRA) 100 MG tablet Take 100 mg by mouth daily as needed for erectile dysfunction.   Yes [provider]    Inpatient Medications:  . acetaminophen  1,000 mg Oral Q6H  . chlorhexidine  15 mL Mouth/Throat Once   Or  . mouth rinse  15 mL Mouth Rinse Once  . ketorolac  15 mg Intravenous Q6H   . sodium chloride    . ceFAZolin    .  ceFAZolin (ANCEF) IV Stopped (06/06/20 1330)  . lactated ringers 10 mL/hr at 06/06/20 7741    Allergies: No Known Allergies  Social History   Socioeconomic History  . Marital status: Divorced  Spouse name: Not on file  . Number of children: Not on file  . Years of education: Not on file  . Highest education level: Not on file  Occupational History  . Not on file  Tobacco Use  . Smoking status: Never Smoker  . Smokeless tobacco: Never Used  Vaping Use  . Vaping Use: Never used  Substance and Sexual Activity  . Alcohol use: Yes    Comment: rarely  . Drug use: Never  . Sexual activity: Yes  Other Topics Concern  . Not on file  Social History  Narrative  . Not on file   Social Determinants of Health   Financial Resource Strain: Not on file  Food Insecurity: Not on file  Transportation Needs: Not on file  Physical Activity: Not on file  Stress: Not on file  Social Connections: Not on file  Intimate Partner Violence: Not on file     History reviewed. No pertinent family history.   Review of Systems Positive for knee pain Negative for: General:  chills, fever, night sweats or weight changes.  Cardiovascular: PND orthopnea syncope dizziness  Dermatological skin lesions rashes Respiratory: Cough congestion Urologic: Frequent urination urination at night and hematuria Abdominal: negative for nausea, vomiting, diarrhea, bright red blood per rectum, melena, or hematemesis Neurologic: negative for visual changes, and/or hearing changes  All other systems reviewed and are otherwise negative except as noted above.  Labs: No results for input(s): CKTOTAL, CKMB, TROPONINI in the last 72 hours. Lab Results  Component Value Date   WBC 5.0 04/25/2020   HGB 13.0 04/25/2020   HCT 41.2 04/25/2020   MCV 84.9 04/25/2020   PLT 252 04/25/2020   No results for input(s): NA, K, CL, CO2, BUN, CREATININE, CALCIUM, PROT, BILITOT, ALKPHOS, ALT, AST, GLUCOSE in the last 168 hours.  Invalid input(s): LABALBU No results found for: CHOL, HDL, LDLCALC, TRIG No results found for: DDIMER  Radiology/Studies:  DG Knee Right Port  Result Date: 06/06/2020 CLINICAL DATA:  Right knee revision EXAM: PORTABLE RIGHT KNEE - 1-2 VIEW COMPARISON:  02/25/2019 FINDINGS: Two view radiograph right knee demonstrates surgical changes of right total knee arthroplasty. Arthroplasty components are in expected alignment. Small ossific fragments are seen within the a posterolateral joint space in keeping with small residual osseous fragments. No unexpected fracture or dislocation intra-articular and subcutaneous gas is noted. IMPRESSION: Status post right total knee  arthroplasty.  Normal alignment. Electronically Signed   By: Helyn Numbers MD   On: 06/06/2020 11:03    EKG: Sinus bradycardia otherwise normal EKG  Weights: There were no vitals filed for this visit.   Physical Exam: Blood pressure (!) 143/91, pulse 62, temperature (!) 97 F (36.1 C), resp. rate 16, SpO2 100 %. There is no height or weight on file to calculate BMI. General: Well developed, well nourished, in no acute distress. Head eyes ears nose throat: Normocephalic, atraumatic, sclera non-icteric, no xanthomas, nares are without discharge. No apparent thyromegaly and/or mass  Lungs: Normal respiratory effort.  no wheezes, no rales, no rhonchi.  Heart: RRR with normal S1 S2. no murmur gallop, no rub, PMI is normal size and placement, carotid upstroke normal without bruit, jugular venous pressure is normal Abdomen: Soft, non-tender, non-distended with normoactive bowel sounds. No hepatomegaly. No rebound/guarding. No obvious abdominal masses. Abdominal aorta is normal size without bruit Extremities: No edema. no cyanosis, no clubbing, no ulcers  Peripheral : 2+ bilateral upper extremity pulses, 2+ bilateral femoral pulses, 2+ bilateral dorsal pedal pulse Neuro:  Alert and oriented. No facial asymmetry. No focal deficit. Moves all extremities spontaneously. Musculoskeletal: Normal muscle tone without kyphosis Psych:  Responds to questions appropriately with a normal affect.    Assessment: 55 year old male with orthopedic surgery with a history of hypertension hyperlipidemia but no evidence of cardiovascular symptoms having sinus rhythm with long ventricular pause consistent with increased vagal tone without evidence of symptoms or consequence and now fully resolved without recurrence  Plan: 1.  Continued observation with postsurgical rehabilitation as per usual postsurgical treatment 2.  No further cardiac intervention or diagnostics necessary at this time 3.  Okay for discharge to  home if ambulating appropriately based on surgery without evidence of further rhythm disturbances or symptoms 4.  We will follow-up as needed as per above  Signed, Lamar Blinks M.D. [facility] Cardiology 06/06/2020, 1:49 PM

## 2020-06-06 NOTE — Discharge Instructions (Addendum)
Orthopedic discharge instructions: May sponge bathe or shower with intact OpSite dressing. Apply ice frequently to knee or use Polar Care. Take Eliquis 2.5 mg BID for 2 weeks starting tomorrow, then take aspirin 325 mg daily for 4 weeks. Take oxycodone as prescribed when needed.  May supplement with ES Tylenol if necessary. May weight-bear as tolerated on right leg - use walker for balance and support. Follow-up in 10-14 days or as scheduled.  AMBULATORY SURGERY  DISCHARGE INSTRUCTIONS   1) The drugs that you were given will stay in your system until tomorrow so for the next 24 hours you should not:  A) Drive an automobile B) Make any legal decisions C) Drink any alcoholic beverage   2) You may resume regular meals tomorrow.  Today it is better to start with liquids and gradually work up to solid foods.  You may eat anything you prefer, but it is better to start with liquids, then soup and crackers, and gradually work up to solid foods.   3) Please notify your doctor immediately if you have any unusual bleeding, trouble breathing, redness and pain at the surgery site, drainage, fever, or pain not relieved by medication.  4) Your post-operative visit with Dr.                                     is: Date:                        Time:    Please call to schedule your post-operative visit.  5) Additional Instructions:  Total Knee Replacement, Care After After the procedure, it is common to have stiffness and discomfort, or redness, pain, and swelling around your cut from surgery (incision). You may also have a small amount of blood or clear fluid coming from your incision. Follow these instructions at home: Your doctor may give you more instructions. If you have problems, contact your doctor. Medicines  Take over-the-counter and prescription medicines only as told by your doctor.  If you were prescribed a blood thinner, take it as told by your doctor.  If told, take steps to  prevent problems with pooping (constipation). You may need to: ? Drink enough fluid to keep your pee (urine) pale yellow. ? Take medicines. You will be told what medicines to take. ? Eat foods that are high in fiber. These include beans, whole grains, and fresh fruits and vegetables. ? Limit foods that are high in fat and sugar. These include fried or sweet foods. Incision care  Follow instructions from your doctor about how to take care of your incision. Make sure you: ? Wash your hands with soap and water for at least 20 seconds before and after you change your bandage. If you cannot use soap and water, use hand sanitizer. ? Change your bandage. ? Leave stitches, staples, or skin glue in place for at least 2 weeks. ? Leave tape strips alone unless you are told to take them off. You may trim the edges of the tape strips if they curl up.  Do not take baths, swim, or use a hot tub until your doctor says it is okay.  Check your incision every day for signs of infection. Check for: ? More redness, swelling, or pain. ? More fluid or blood. ? Warmth. ? Pus or a bad smell.   Activity  Rest as told by your  doctor.  Get up to take short walks every 1 to 2 hours. Ask for help if you feel weak or unsteady.  Follow instructions from your doctor about: ? Using a walker, crutches, or a cane. ? How much body weight you may safely support on your affected leg (weight-bearing restrictions). ? How to get out of a bed and chair and how to go up and down stairs. A physical therapist will show you how to do this.  Do exercises as told by your doctor or physical therapist.  Avoid activities that put stress on your knees. These include running, jumping rope, and doing jumping jacks.  Do not play contact sports until your doctor says it is okay.  Return to your normal activities when your doctor says that it is safe. Managing pain, stiffness, and swelling  If told, put ice on your knee. To do  this: ? Put ice in a plastic bag or use an icing device (cold flow pad). Follow your doctor's directions about how to use the icing device. ? Place a towel between your skin and the bag or between your skin and the icing device. ? Leave the ice on for 20 minutes, 2-3 times a day. ? Take off the ice if your skin turns bright red. This is very important. If you cannot feel pain, heat, or cold, you have a greater risk of damage to the area.  Move your toes often.  Raise your leg above the level of your heart while you are sitting or lying down. ? Use a few pillows to keep your leg straight. ? Do not put a pillow just under the knee. If the knee is bent for a long time, this may make the knee stiff.  Wear elastic knee support as told by your doctor.   Safety  To help prevent falls: ? Keep floors clear of objects you may trip over. ? Place items that you may need within easy reach.  Wear an apron or tool belt with pockets for carrying objects. This leaves your hands free to help with your balance.  Do not drive or use machines until your doctor says it is safe.   General instructions  Wear compression stockings as told by your doctor.  Continue with breathing exercises. This helps prevent lung infection.  Do not smoke or use any products that contain nicotine or tobacco. If you need help quitting, ask your doctor.  If you plan to visit a dentist: ? Tell your doctor. Ask about things to do before your teeth are cleaned. ? Tell your dentist about your new joint.  Keep all follow-up visits. Contact a doctor if:  You have a fever or chills.  You have a cough or feel short of breath.  Your medicine is not controlling your pain.  You have any of these signs of infection around your incision: ? More redness, swelling, or pain. ? More fluid or blood. ? Warmth. ? Pus or a bad smell.  You fall. Get help right away if:  You have very bad pain.  You have trouble breathing.  You  have chest pain.  You have pain in your calf or leg.  You have redness, swelling, or warmth in your calf or leg.  Your incision breaks open after the stitches or staples are taken out. These symptoms may be an emergency. Get help right away. Call your local emergency services (911 in the [place]  Do not wait to see if the symptoms will  go away.  Do not drive yourself to the hospital. Summary  After the procedure, it is common to have stiffness and discomfort, or redness, pain, and swelling around your incision.  Follow instructions from your doctor about how to take care of your incision.  Use crutches, a walker, or a cane as told by your doctor.  If you were prescribed a blood thinner, take it as told by your doctor.  Keep all follow-up visits. This information is not intended to replace advice given to you by your health care provider. Make sure you discuss any questions you have with your health care provider. Document Revised: 10/12/2019 Document Reviewed: 10/12/2019 Elsevier Patient Education  2021 [employer]  Bupivacaine Liposomal Suspension for Injection What is this medicine? BUPIVACAINE LIPOSOMAL (bue PIV a kane LIP oh som al) is an anesthetic. It causes loss of feeling in the skin or other tissues. It is used to prevent and to treat pain from some procedures. This medicine may be used for other purposes; ask your health care provider or pharmacist if you have questions. COMMON BRAND NAME(S): EXPAREL What should I tell my health care provider before I take this medicine? They need to know if you have any of these conditions:  G6PD deficiency  heart disease  kidney disease  liver disease  low blood pressure  lung or breathing disease, like asthma  an unusual or allergic reaction to bupivacaine, other medicines, foods, dyes, or preservatives  pregnant or trying to get pregnant  breast-feeding How should I use this medicine? This medicine is injected into  the affected area. It is given by a health care provider in a hospital or clinic setting. Talk to your health care provider about the use of this medicine in children. While it may be given to children as young as 6 years for selected conditions, precautions do apply. Overdosage: If you think you have taken too much of this medicine contact a poison control center or emergency room at once. NOTE: This medicine is only for you. Do not share this medicine with others. What if I miss a dose? This does not apply. What may interact with this medicine? This medicine may interact with the following medications:  acetaminophen  certain antibiotics like dapsone, nitrofurantoin, aminosalicylic acid, sulfonamides  certain medicines for seizures like phenobarbital, phenytoin, valproic acid  chloroquine  cyclophosphamide  flutamide  hydroxyurea  ifosfamide  metoclopramide  nitric oxide  nitroglycerin  nitroprusside  nitrous oxide  other local anesthetics like lidocaine, pramoxine, tetracaine  primaquine  quinine  rasburicase  sulfasalazine This list may not describe all possible interactions. Give your health care provider a list of all the medicines, herbs, non-prescription drugs, or dietary supplements you use. Also tell them if you smoke, drink alcohol, or use illegal drugs. Some items may interact with your medicine. What should I watch for while using this medicine? Your condition will be monitored carefully while you are receiving this medicine. Be careful to avoid injury while the area is numb, and you are not aware of pain. What side effects may I notice from receiving this medicine? Side effects that you should report to your doctor or health care professional as soon as possible:  allergic reactions like skin rash, itching or hives, swelling of the face, lips, or tongue  seizures  signs and symptoms of a dangerous change in heartbeat or heart rhythm like chest  pain; dizziness; fast, irregular heartbeat; palpitations; feeling faint or lightheaded; falls; breathing problems  signs and  symptoms of methemoglobinemia such as pale, gray, or blue colored skin; headache; fast heartbeat; shortness of breath; feeling faint or lightheaded, falls; tiredness Side effects that usually do not require medical attention (report to your doctor or health care professional if they continue or are bothersome):  anxious  back pain  changes in taste  changes in vision  constipation  dizziness  fever  nausea, vomiting This list may not describe all possible side effects. Call your doctor for medical advice about side effects. You may report side effects to FDA at 1-800-FDA-1088. Where should I keep my medicine? This drug is given in a hospital or clinic and will not be stored at home. NOTE: This sheet is a summary. It may not cover all possible information. If you have questions about this medicine, talk to your doctor, pharmacist, or health care provider.  2021 Elsevier/Gold Standard (2019-07-29 12:24:57)

## 2020-06-07 LAB — SURGICAL PATHOLOGY

## 2020-06-08 ENCOUNTER — Encounter: Payer: Self-pay | Admitting: Surgery

## 2020-06-11 LAB — AEROBIC/ANAEROBIC CULTURE W GRAM STAIN (SURGICAL/DEEP WOUND): Culture: NO GROWTH

## 2020-09-28 ENCOUNTER — Other Ambulatory Visit: Payer: Self-pay | Admitting: Surgery

## 2020-09-28 ENCOUNTER — Other Ambulatory Visit (HOSPITAL_COMMUNITY): Payer: Self-pay | Admitting: Surgery

## 2020-09-28 DIAGNOSIS — S46101A Unspecified injury of muscle, fascia and tendon of long head of biceps, right arm, initial encounter: Secondary | ICD-10-CM

## 2020-10-10 ENCOUNTER — Other Ambulatory Visit: Payer: Self-pay

## 2020-10-10 ENCOUNTER — Ambulatory Visit
Admission: RE | Admit: 2020-10-10 | Discharge: 2020-10-10 | Disposition: A | Discharge status: Home / Self Care | Payer: No Typology Code available for payment source | Source: Ambulatory Visit | Attending: Surgery | Admitting: Surgery

## 2020-10-10 DIAGNOSIS — S46101A Unspecified injury of muscle, fascia and tendon of long head of biceps, right arm, initial encounter: Secondary | ICD-10-CM | POA: Insufficient documentation

## 2022-10-01 IMAGING — MR MR SHOULDER*R* W/O CM
5 series · 40 of 40 positions shown · non-contrast
Comparison: None.

CLINICAL DATA: Lifting something in [REDACTED] and felt a pop. Right
shoulder pain with limited range of motion.

EXAM:
MRI OF THE RIGHT SHOULDER WITHOUT CONTRAST
TECHNIQUE: Multiplanar, multisequence MR imaging of the shoulder was performed.
No intravenous contrast was administered.

[Series 3: T2 fat-sat · axial · 4.0mm · 0.59mm/px · z∈[+12,+96]mm · 10 of 21 slices shown (1 of 3)]
[im 1/21]
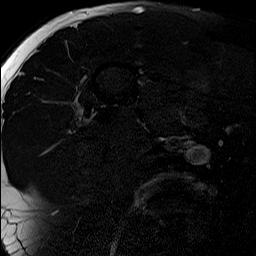
[im 3/21]
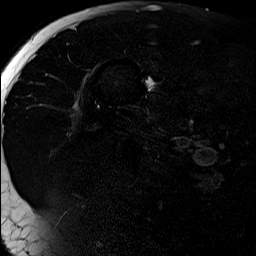
[im 5/21]
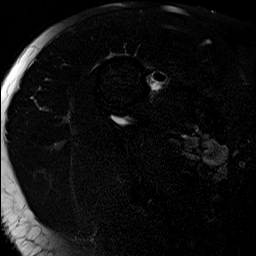
[im 7/21]
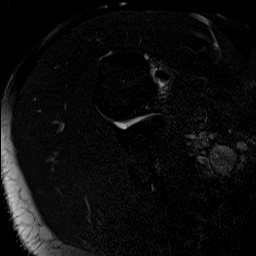
[im 9/21]
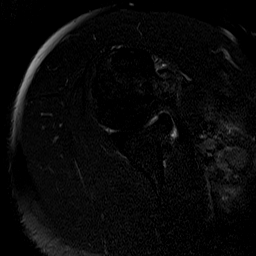
[im 12/21]
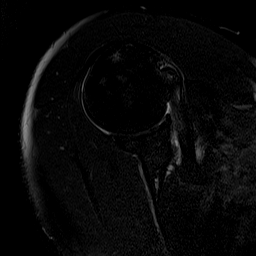
[im 14/21]
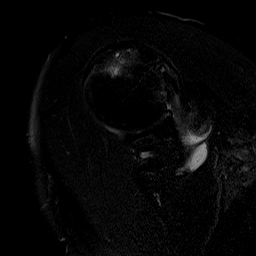
[im 16/21]
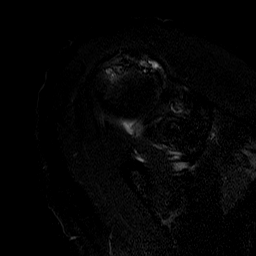
[im 18/21]
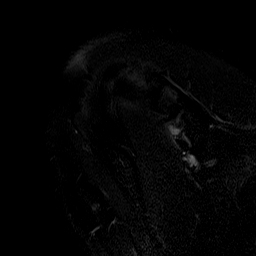
[im 21/21]
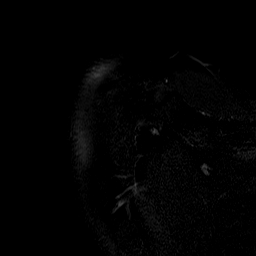

[Series 4: T2 fat-sat · oblique · 4.0mm · 0.59mm/px · 7 of 17 slices shown (2 of 3)]
[im 1/17]
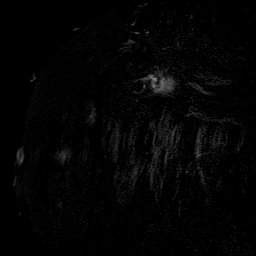
[im 3/17]
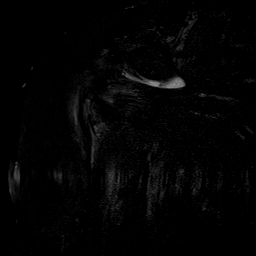
[im 6/17]
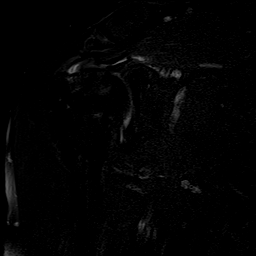
[im 9/17]
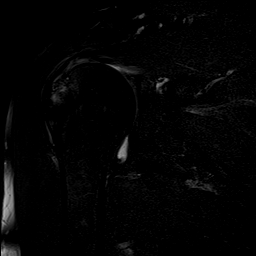
[im 11/17]
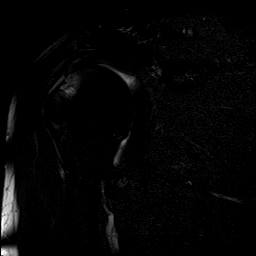
[im 14/17]
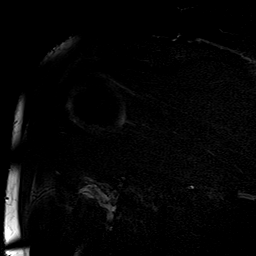
[im 17/17]
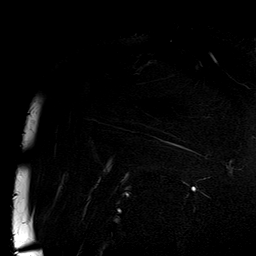

[Series 5: PD · oblique · 4.0mm · 0.59mm/px · 7 of 17 slices shown]
[im 1/17]
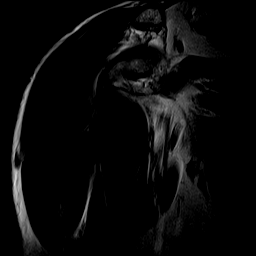
[im 3/17]
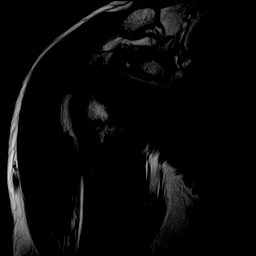
[im 6/17]
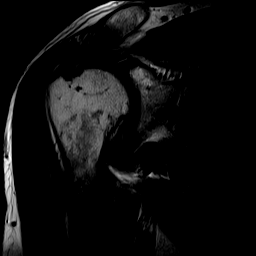
[im 9/17]
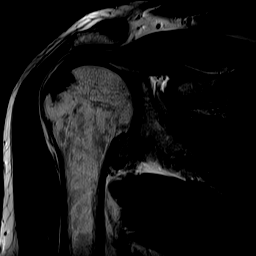
[im 11/17]
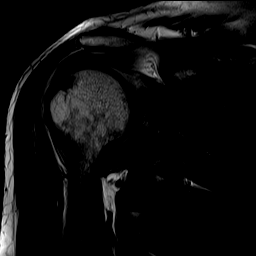
[im 14/17]
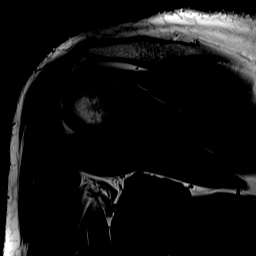
[im 17/17]
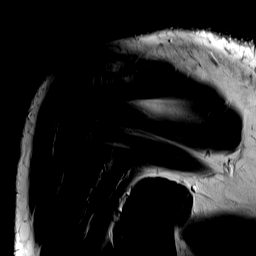

[Series 6: T1 · oblique · 4.0mm · 0.59mm/px · 8 of 20 slices shown]
[im 1/20]
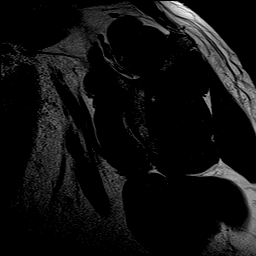
[im 3/20]
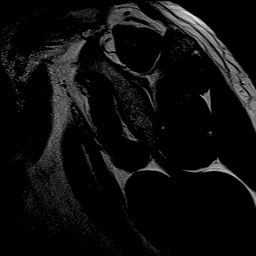
[im 6/20]
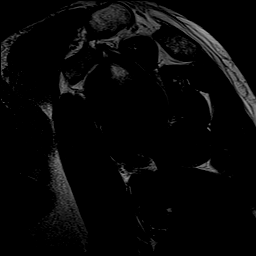
[im 9/20]
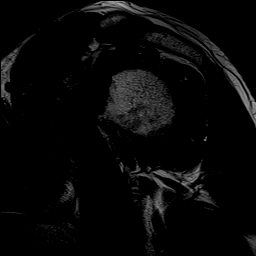
[im 11/20]
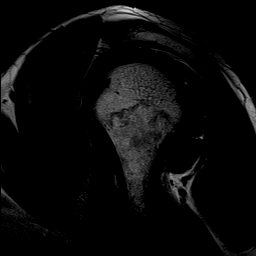
[im 14/20]
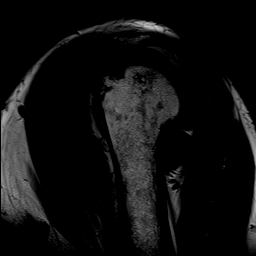
[im 17/20]
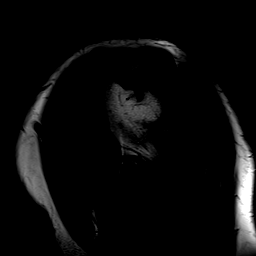
[im 20/20]
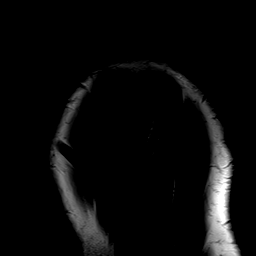

[Series 7: T2 fat-sat · oblique · 4.0mm · 0.59mm/px · 8 of 20 slices shown (3 of 3)]
[im 1/20]
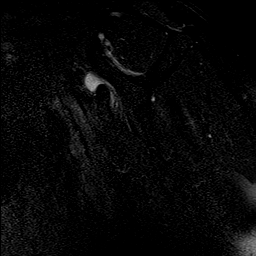
[im 3/20]
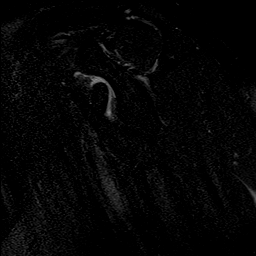
[im 6/20]
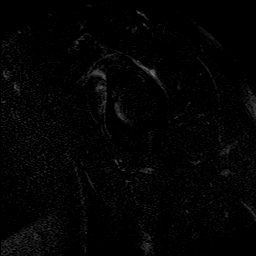
[im 9/20]
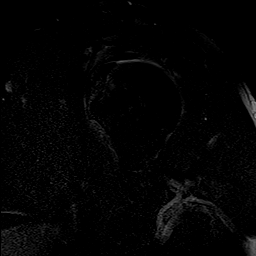
[im 11/20]
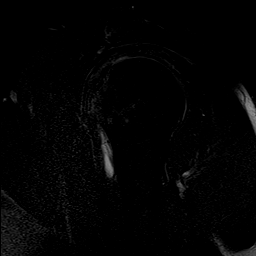
[im 14/20]
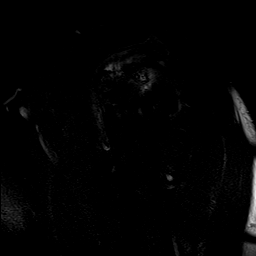
[im 17/20]
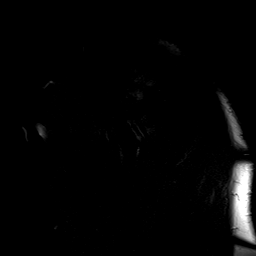
[im 20/20]
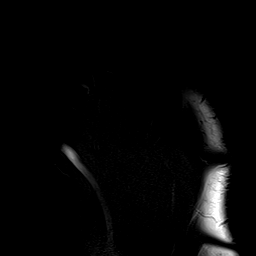

[40 of 40 positions shown; findings below may reference images not displayed]

FINDINGS: Rotator cuff: There is a high-grade, near full width articular sided
tear of the supraspinatus tendon at the footprint with retraction of
articular sided fibers by approximately 2.2 cm. Intermediate grade
articular sided tearing of the infraspinatus tendon at and near the
footprint. Teres minor tendon is intact. Intermediate grade
articular sided tearing of the cephalad fibers of the subscapularis
tendon.

Muscles: No significant muscle atrophy.

Biceps Long Head: Tendinosis of the intra-articular and proximal
extra-articular long head biceps tendon with fraying and mild
medialization into the subscapularis near the top of the bicipital
groove.

Acromioclavicular Joint: Mild arthropathy of the acromioclavicular
joint. No significant subacromial/subdeltoid bursal fluid.

Glenohumeral Joint: Small joint effusion.  Mild chondrosis.

Labrum: Degenerative anterior superior labral tearing.

Bones: Degenerative cystic change at the cuff insertion on the
greater tuberosity. No acute fracture or dislocation. No aggressive
osseous lesion.

Other: No fluid collection or hematoma.
IMPRESSION: High-grade, near full width articular sided tear of the
supraspinatus tendon at the footprint with retraction of articular
sided fibers.

Intermediate grade articular sided tearing of the infraspinatus
tendon at and near the footprint.

Intermediate grade articular sided tearing of the cephalad fibers of
the subscapularis tendon.

Tendinosis of the intra-articular and proximal extra-articular long
head biceps tendon with tendon fraying.

Mild glenohumeral chondrosis with degenerative anterior superior
labral tearing. Small joint effusion.
